# Patient Record
Sex: Male | Born: 1935 | Race: White | Hispanic: No | Marital: Married | Smoking: Former smoker
Health system: Southern US, Community
[De-identification: ages and names within clinical notes are randomized; demographics above are authoritative.]

## PROBLEM LIST (undated history)

## (undated) DIAGNOSIS — R001 Bradycardia, unspecified: Secondary | ICD-10-CM

## (undated) DIAGNOSIS — G8929 Other chronic pain: Secondary | ICD-10-CM

## (undated) DIAGNOSIS — I639 Cerebral infarction, unspecified: Secondary | ICD-10-CM

## (undated) DIAGNOSIS — E7800 Pure hypercholesterolemia, unspecified: Secondary | ICD-10-CM

## (undated) DIAGNOSIS — I5032 Chronic diastolic (congestive) heart failure: Secondary | ICD-10-CM

## (undated) DIAGNOSIS — I2510 Atherosclerotic heart disease of native coronary artery without angina pectoris: Secondary | ICD-10-CM

## (undated) DIAGNOSIS — I10 Essential (primary) hypertension: Secondary | ICD-10-CM

## (undated) DIAGNOSIS — I442 Atrioventricular block, complete: Secondary | ICD-10-CM

## (undated) DIAGNOSIS — I509 Heart failure, unspecified: Secondary | ICD-10-CM

## (undated) DIAGNOSIS — R079 Chest pain, unspecified: Secondary | ICD-10-CM

## (undated) DIAGNOSIS — M5416 Radiculopathy, lumbar region: Secondary | ICD-10-CM

## (undated) DIAGNOSIS — Z45018 Encounter for adjustment and management of other part of cardiac pacemaker: Secondary | ICD-10-CM

## (undated) DIAGNOSIS — I6529 Occlusion and stenosis of unspecified carotid artery: Secondary | ICD-10-CM

## (undated) DIAGNOSIS — I059 Rheumatic mitral valve disease, unspecified: Secondary | ICD-10-CM

## (undated) DIAGNOSIS — S72109A Unspecified trochanteric fracture of unspecified femur, initial encounter for closed fracture: Secondary | ICD-10-CM

## (undated) DIAGNOSIS — M5442 Lumbago with sciatica, left side: Secondary | ICD-10-CM

## (undated) DIAGNOSIS — M1990 Unspecified osteoarthritis, unspecified site: Secondary | ICD-10-CM

## (undated) DIAGNOSIS — Z950 Presence of cardiac pacemaker: Secondary | ICD-10-CM

## (undated) DIAGNOSIS — M25552 Pain in left hip: Secondary | ICD-10-CM

## (undated) DIAGNOSIS — N189 Chronic kidney disease, unspecified: Secondary | ICD-10-CM

## (undated) DIAGNOSIS — M4150 Other secondary scoliosis, site unspecified: Secondary | ICD-10-CM

## (undated) DIAGNOSIS — I219 Acute myocardial infarction, unspecified: Secondary | ICD-10-CM

## (undated) DIAGNOSIS — E119 Type 2 diabetes mellitus without complications: Secondary | ICD-10-CM

## (undated) DIAGNOSIS — M549 Dorsalgia, unspecified: Secondary | ICD-10-CM

## (undated) DIAGNOSIS — M48061 Spinal stenosis, lumbar region without neurogenic claudication: Secondary | ICD-10-CM

## (undated) DIAGNOSIS — Z9889 Other specified postprocedural states: Secondary | ICD-10-CM

## (undated) DIAGNOSIS — M4316 Spondylolisthesis, lumbar region: Secondary | ICD-10-CM

## (undated) DIAGNOSIS — G709 Myoneural disorder, unspecified: Secondary | ICD-10-CM

## (undated) DIAGNOSIS — G4730 Sleep apnea, unspecified: Secondary | ICD-10-CM

## (undated) DIAGNOSIS — K5790 Diverticulosis of intestine, part unspecified, without perforation or abscess without bleeding: Secondary | ICD-10-CM

## (undated) DIAGNOSIS — K219 Gastro-esophageal reflux disease without esophagitis: Secondary | ICD-10-CM

## (undated) DIAGNOSIS — I459 Conduction disorder, unspecified: Secondary | ICD-10-CM

## (undated) DIAGNOSIS — I480 Paroxysmal atrial fibrillation: Secondary | ICD-10-CM

## (undated) HISTORY — DX: Conduction disorder, unspecified: I45.9

## (undated) HISTORY — PX: APPENDECTOMY: SHX54

## (undated) HISTORY — DX: Other specified postprocedural states: Z98.890

## (undated) HISTORY — DX: Other secondary scoliosis, site unspecified: M41.50

## (undated) HISTORY — PX: AORTIC VALVE REPLACEMENT: SHX41

## (undated) HISTORY — DX: Spondylolisthesis, lumbar region: M43.16

## (undated) HISTORY — DX: Chest pain, unspecified: R07.9

## (undated) HISTORY — DX: Spinal stenosis, lumbar region without neurogenic claudication: M48.061

## (undated) HISTORY — PX: CORONARY ARTERY BYPASS GRAFT: SHX141

## (undated) HISTORY — DX: Other chronic pain: G89.29

## (undated) HISTORY — DX: Encounter for adjustment and management of other part of cardiac pacemaker: Z45.018

## (undated) HISTORY — PX: BACK SURGERY: SHX140

## (undated) HISTORY — DX: Chronic diastolic (congestive) heart failure: I50.32

## (undated) HISTORY — DX: Bradycardia, unspecified: R00.1

## (undated) HISTORY — DX: Radiculopathy, lumbar region: M54.16

## (undated) HISTORY — DX: Pain in left hip: M25.552

## (undated) HISTORY — DX: Essential (primary) hypertension: I10

## (undated) HISTORY — PX: COLON SURGERY: SHX602

## (undated) HISTORY — DX: Atrioventricular block, complete: I44.2

## (undated) HISTORY — PX: CORONARY ANGIOPLASTY: SHX604

## (undated) HISTORY — DX: Atherosclerotic heart disease of native coronary artery without angina pectoris: I25.10

## (undated) HISTORY — PX: HERNIA REPAIR: SHX51

## (undated) HISTORY — DX: Rheumatic mitral valve disease, unspecified: I05.9

## (undated) HISTORY — DX: Paroxysmal atrial fibrillation: I48.0

## (undated) HISTORY — DX: Lumbago with sciatica, left side: M54.42

## (undated) HISTORY — DX: Occlusion and stenosis of unspecified carotid artery: I65.29

---

## 2014-08-11 DIAGNOSIS — E119 Type 2 diabetes mellitus without complications: Secondary | ICD-10-CM | POA: Diagnosis not present

## 2014-08-11 DIAGNOSIS — I2510 Atherosclerotic heart disease of native coronary artery without angina pectoris: Secondary | ICD-10-CM | POA: Diagnosis not present

## 2014-08-11 DIAGNOSIS — I10 Essential (primary) hypertension: Secondary | ICD-10-CM | POA: Diagnosis not present

## 2014-08-11 DIAGNOSIS — N189 Chronic kidney disease, unspecified: Secondary | ICD-10-CM | POA: Diagnosis not present

## 2014-09-22 DIAGNOSIS — M5136 Other intervertebral disc degeneration, lumbar region: Secondary | ICD-10-CM | POA: Diagnosis not present

## 2014-09-22 DIAGNOSIS — M5126 Other intervertebral disc displacement, lumbar region: Secondary | ICD-10-CM | POA: Diagnosis not present

## 2014-09-22 DIAGNOSIS — Z6835 Body mass index (BMI) 35.0-35.9, adult: Secondary | ICD-10-CM | POA: Diagnosis not present

## 2014-09-22 DIAGNOSIS — R399 Unspecified symptoms and signs involving the genitourinary system: Secondary | ICD-10-CM | POA: Diagnosis not present

## 2014-09-22 DIAGNOSIS — M4806 Spinal stenosis, lumbar region: Secondary | ICD-10-CM | POA: Diagnosis not present

## 2014-09-29 DIAGNOSIS — E1149 Type 2 diabetes mellitus with other diabetic neurological complication: Secondary | ICD-10-CM | POA: Diagnosis not present

## 2014-09-29 DIAGNOSIS — N189 Chronic kidney disease, unspecified: Secondary | ICD-10-CM | POA: Diagnosis not present

## 2014-09-29 DIAGNOSIS — E785 Hyperlipidemia, unspecified: Secondary | ICD-10-CM | POA: Diagnosis not present

## 2014-09-29 DIAGNOSIS — Z125 Encounter for screening for malignant neoplasm of prostate: Secondary | ICD-10-CM | POA: Diagnosis not present

## 2014-10-01 DIAGNOSIS — E1149 Type 2 diabetes mellitus with other diabetic neurological complication: Secondary | ICD-10-CM | POA: Diagnosis not present

## 2014-10-01 DIAGNOSIS — I130 Hypertensive heart and chronic kidney disease with heart failure and stage 1 through stage 4 chronic kidney disease, or unspecified chronic kidney disease: Secondary | ICD-10-CM | POA: Diagnosis not present

## 2014-10-01 DIAGNOSIS — E1129 Type 2 diabetes mellitus with other diabetic kidney complication: Secondary | ICD-10-CM | POA: Diagnosis not present

## 2014-10-01 DIAGNOSIS — E785 Hyperlipidemia, unspecified: Secondary | ICD-10-CM | POA: Diagnosis not present

## 2014-10-01 DIAGNOSIS — I2510 Atherosclerotic heart disease of native coronary artery without angina pectoris: Secondary | ICD-10-CM | POA: Diagnosis not present

## 2014-10-15 DIAGNOSIS — M4807 Spinal stenosis, lumbosacral region: Secondary | ICD-10-CM | POA: Diagnosis not present

## 2014-10-15 DIAGNOSIS — M961 Postlaminectomy syndrome, not elsewhere classified: Secondary | ICD-10-CM | POA: Diagnosis not present

## 2014-10-24 DIAGNOSIS — Z981 Arthrodesis status: Secondary | ICD-10-CM | POA: Diagnosis not present

## 2014-10-24 DIAGNOSIS — M47896 Other spondylosis, lumbar region: Secondary | ICD-10-CM | POA: Diagnosis not present

## 2014-10-24 DIAGNOSIS — M4807 Spinal stenosis, lumbosacral region: Secondary | ICD-10-CM | POA: Diagnosis not present

## 2014-10-24 DIAGNOSIS — M4806 Spinal stenosis, lumbar region: Secondary | ICD-10-CM | POA: Diagnosis not present

## 2014-10-24 DIAGNOSIS — M5126 Other intervertebral disc displacement, lumbar region: Secondary | ICD-10-CM | POA: Diagnosis not present

## 2014-10-27 DIAGNOSIS — M961 Postlaminectomy syndrome, not elsewhere classified: Secondary | ICD-10-CM | POA: Diagnosis not present

## 2014-10-27 DIAGNOSIS — M4807 Spinal stenosis, lumbosacral region: Secondary | ICD-10-CM | POA: Diagnosis not present

## 2014-11-04 DIAGNOSIS — I480 Paroxysmal atrial fibrillation: Secondary | ICD-10-CM | POA: Diagnosis not present

## 2014-11-04 DIAGNOSIS — E119 Type 2 diabetes mellitus without complications: Secondary | ICD-10-CM | POA: Diagnosis not present

## 2014-11-04 DIAGNOSIS — I10 Essential (primary) hypertension: Secondary | ICD-10-CM | POA: Diagnosis not present

## 2014-11-04 DIAGNOSIS — I2510 Atherosclerotic heart disease of native coronary artery without angina pectoris: Secondary | ICD-10-CM | POA: Diagnosis not present

## 2014-11-08 DIAGNOSIS — M4807 Spinal stenosis, lumbosacral region: Secondary | ICD-10-CM | POA: Diagnosis not present

## 2014-11-08 DIAGNOSIS — M47897 Other spondylosis, lumbosacral region: Secondary | ICD-10-CM | POA: Diagnosis not present

## 2014-11-08 DIAGNOSIS — M5126 Other intervertebral disc displacement, lumbar region: Secondary | ICD-10-CM | POA: Diagnosis not present

## 2014-11-08 DIAGNOSIS — M47817 Spondylosis without myelopathy or radiculopathy, lumbosacral region: Secondary | ICD-10-CM | POA: Diagnosis not present

## 2014-11-11 DIAGNOSIS — Z950 Presence of cardiac pacemaker: Secondary | ICD-10-CM | POA: Diagnosis not present

## 2014-11-11 DIAGNOSIS — Z01818 Encounter for other preprocedural examination: Secondary | ICD-10-CM | POA: Diagnosis not present

## 2014-11-11 DIAGNOSIS — I2510 Atherosclerotic heart disease of native coronary artery without angina pectoris: Secondary | ICD-10-CM | POA: Diagnosis not present

## 2014-11-11 DIAGNOSIS — Z01810 Encounter for preprocedural cardiovascular examination: Secondary | ICD-10-CM | POA: Diagnosis not present

## 2014-11-14 DIAGNOSIS — I6522 Occlusion and stenosis of left carotid artery: Secondary | ICD-10-CM | POA: Diagnosis not present

## 2014-11-14 DIAGNOSIS — E119 Type 2 diabetes mellitus without complications: Secondary | ICD-10-CM | POA: Diagnosis not present

## 2014-11-14 DIAGNOSIS — E785 Hyperlipidemia, unspecified: Secondary | ICD-10-CM | POA: Diagnosis not present

## 2014-11-14 DIAGNOSIS — I10 Essential (primary) hypertension: Secondary | ICD-10-CM | POA: Diagnosis not present

## 2014-11-14 DIAGNOSIS — I2510 Atherosclerotic heart disease of native coronary artery without angina pectoris: Secondary | ICD-10-CM | POA: Diagnosis not present

## 2014-11-14 DIAGNOSIS — I6523 Occlusion and stenosis of bilateral carotid arteries: Secondary | ICD-10-CM | POA: Diagnosis not present

## 2014-11-15 DIAGNOSIS — I130 Hypertensive heart and chronic kidney disease with heart failure and stage 1 through stage 4 chronic kidney disease, or unspecified chronic kidney disease: Secondary | ICD-10-CM | POA: Diagnosis not present

## 2014-11-15 DIAGNOSIS — E1129 Type 2 diabetes mellitus with other diabetic kidney complication: Secondary | ICD-10-CM | POA: Diagnosis not present

## 2014-11-15 DIAGNOSIS — Z6835 Body mass index (BMI) 35.0-35.9, adult: Secondary | ICD-10-CM | POA: Diagnosis not present

## 2014-11-15 DIAGNOSIS — I2510 Atherosclerotic heart disease of native coronary artery without angina pectoris: Secondary | ICD-10-CM | POA: Diagnosis not present

## 2014-11-16 DIAGNOSIS — M4807 Spinal stenosis, lumbosacral region: Secondary | ICD-10-CM | POA: Diagnosis not present

## 2014-11-16 DIAGNOSIS — M961 Postlaminectomy syndrome, not elsewhere classified: Secondary | ICD-10-CM | POA: Diagnosis not present

## 2014-11-17 ENCOUNTER — Ambulatory Visit: Payer: Self-pay | Admitting: Orthopedic Surgery

## 2014-11-21 ENCOUNTER — Ambulatory Visit: Payer: Self-pay | Admitting: Orthopedic Surgery

## 2014-11-21 NOTE — H&P (Signed)
Travis [name] is an 81 y.o. male.   Chief Complaint: back and B/L leg pain HPI: The patient is a 81 year old male who presents today for follow up of their back. The patient is being followed for their low back symptoms. They are now year(s) out from when symptoms began. Symptoms reported today include: pain. Current treatment includes: activity modification and pain medications. The following medication has been used for pain control: Ultram. The patient presents today following MRI and ESI left L3-4 x 8 days. The patient reports the injection did not help. The patient indicates that they have questions or concerns today regarding surgery.  Travis SabaRufus Erxleben presents here with his family. He still reports an inability to ambulate. He has to sit. He does have some pain radiating down the leg even when he sits. He thinks it is in the back and also in the front.  Review of systems is negative for fevers, chest pain, shortness of breath, unexplained recent weight loss, loss of bowel or bladder function, burning with urination, joint swelling, rashes, weakness or numbness, difficulty with balance, easy bruising, excessive thirst or frequent urination.  He has been cleared by his cardiologist and his medical physician. His sugars are well controlled.  No past medical history on file.  No past surgical history on file.  No family history on file. Social History:  has no tobacco, alcohol, and drug history on file.  Allergies: Allergies not on file   (Not in a hospital admission)  No results found for this or any previous visit (from the past 48 hour(s)). No results found.  Review of Systems  Constitutional: Negative.   HENT: Negative.   Eyes: Negative.   Respiratory: Negative.   Cardiovascular: Negative.   Gastrointestinal: Negative.   Genitourinary: Negative.   Musculoskeletal: Positive for back pain.  Skin: Negative.   Neurological: Positive for focal weakness.  Psychiatric/Behavioral:  Negative.     There were no vitals taken for this visit. Physical Exam  Constitutional: He is oriented to person, place, and time. He appears well-developed and well-nourished.  HENT:  Head: Normocephalic and atraumatic.  Eyes: Conjunctivae and EOM are normal. Pupils are equal, round, and reactive to light.  Neck: Normal range of motion. Neck supple.  Cardiovascular: Normal rate and regular rhythm.   Respiratory: Effort normal and breath sounds normal.  GI: Soft. Bowel sounds are normal.  Musculoskeletal:  On exam, he walks with a forward flexed antalgic gait. Straight leg raise is lying flat, pain on the right. He has quads weakness bilaterally. Limited flexion and extension. No instability of hips, knees, and ankles.  Neurological: He is alert and oriented to person, place, and time. He has normal reflexes.  Skin: Skin is warm and dry.  Psychiatric: He has a normal mood and affect.    His x-rays demonstrate previous decompression, laminectomies of L5.  His MRI demonstrates severe stenosis at 3-4, moderate lateral recess at 4-5, and at T3. He has a cyst at 5-1 that it is unchanged.  Assessment/Plan Spinal stenosis 1. Neurogenic claudication. 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 Family and he would like to proceed with decompression. I had an extensive discussion of the risks and benefits of the lumbar decompression with the patient including bleeding, infection, damage to neurovascular structures, epidural fibrosis, CSF leak requiring repair. We also discussed increase in pain, adjacent segment disease, recurrent disc herniation, need for future surgery including repeat decompression and/or fusion. We  also discussed risks of postoperative hematoma, paralysis, anesthetic complications including DVT, PE, death, cardiopulmonary dysfunction. In addition, the perioperative and postoperative courses were discussed in detail  including the rehabilitative time and return to functional activity and work. I provided the patient with an illustrated handout and utilized the appropriate surgical models.  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 Also indicated that predominance of his symptoms are at 3-4 of the stenosis. We will try to decompress 4-5, but there was excessive scar tissue from his previous surgery and I would preclude further exploration intraoperatively based upon the risk, same for the cyst at L5-S1. I do not believe he is symptomatic from that given that was present on his previous studies when these symptoms were not present.  Plan lumbar decompression L2-3, L3-4, possible L4-5  Atlas Kuc M. PA-C for Dr. Beane 11/21/2014, 9:12 AM    

## 2014-11-28 ENCOUNTER — Other Ambulatory Visit (HOSPITAL_COMMUNITY): Payer: Self-pay | Admitting: *Deleted

## 2014-11-29 ENCOUNTER — Encounter (HOSPITAL_COMMUNITY): Payer: Self-pay

## 2014-11-29 ENCOUNTER — Ambulatory Visit (HOSPITAL_COMMUNITY)
Admission: RE | Admit: 2014-11-29 | Discharge: 2014-11-29 | Disposition: A | Discharge status: Home / Self Care | Payer: Medicare Other | Source: Ambulatory Visit | Attending: Orthopedic Surgery | Admitting: Orthopedic Surgery

## 2014-11-29 ENCOUNTER — Encounter (HOSPITAL_COMMUNITY)
Admission: RE | Admit: 2014-11-29 | Discharge: 2014-11-29 | Disposition: A | Discharge status: Home / Self Care | Payer: Medicare Other | Source: Ambulatory Visit | Attending: Specialist | Admitting: Specialist

## 2014-11-29 DIAGNOSIS — M4806 Spinal stenosis, lumbar region: Secondary | ICD-10-CM | POA: Diagnosis not present

## 2014-11-29 DIAGNOSIS — M5136 Other intervertebral disc degeneration, lumbar region: Secondary | ICD-10-CM | POA: Diagnosis not present

## 2014-11-29 DIAGNOSIS — Z01818 Encounter for other preprocedural examination: Secondary | ICD-10-CM | POA: Insufficient documentation

## 2014-11-29 DIAGNOSIS — M47816 Spondylosis without myelopathy or radiculopathy, lumbar region: Secondary | ICD-10-CM | POA: Diagnosis not present

## 2014-11-29 DIAGNOSIS — M48061 Spinal stenosis, lumbar region without neurogenic claudication: Secondary | ICD-10-CM

## 2014-11-29 HISTORY — DX: Presence of cardiac pacemaker: Z95.0

## 2014-11-29 HISTORY — DX: Unspecified osteoarthritis, unspecified site: M19.90

## 2014-11-29 HISTORY — DX: Essential (primary) hypertension: I10

## 2014-11-29 HISTORY — DX: Diverticulosis of intestine, part unspecified, without perforation or abscess without bleeding: K57.90

## 2014-11-29 HISTORY — DX: Dorsalgia, unspecified: M54.9

## 2014-11-29 HISTORY — DX: Unspecified trochanteric fracture of unspecified femur, initial encounter for closed fracture: S72.109A

## 2014-11-29 HISTORY — DX: Sleep apnea, unspecified: G47.30

## 2014-11-29 HISTORY — DX: Cerebral infarction, unspecified: I63.9

## 2014-11-29 HISTORY — DX: Acute myocardial infarction, unspecified: I21.9

## 2014-11-29 HISTORY — DX: Myoneural disorder, unspecified: G70.9

## 2014-11-29 HISTORY — DX: Chronic kidney disease, unspecified: N18.9

## 2014-11-29 HISTORY — DX: Pure hypercholesterolemia, unspecified: E78.00

## 2014-11-29 HISTORY — DX: Type 2 diabetes mellitus without complications: E11.9

## 2014-11-29 HISTORY — DX: Heart failure, unspecified: I50.9

## 2014-11-29 HISTORY — DX: Gastro-esophageal reflux disease without esophagitis: K21.9

## 2014-11-29 HISTORY — DX: Atherosclerotic heart disease of native coronary artery without angina pectoris: I25.10

## 2014-11-29 LAB — BASIC METABOLIC PANEL
Anion gap: 8 (ref 5–15)
BUN: 32 mg/dL — ABNORMAL HIGH (ref 6–23)
CO2: 25 mmol/L (ref 19–32)
Calcium: 9.6 mg/dL (ref 8.4–10.5)
Chloride: 106 mmol/L (ref 96–112)
Creatinine, Ser: 2 mg/dL — ABNORMAL HIGH (ref 0.50–1.35)
GFR, EST AFRICAN AMERICAN: 34 mL/min — AB (ref >90)
GFR, EST NON AFRICAN AMERICAN: 30 mL/min — AB (ref >90)
Glucose, Bld: 191 mg/dL — ABNORMAL HIGH (ref 70–99)
POTASSIUM: 5.2 mmol/L — AB (ref 3.5–5.1)
SODIUM: 139 mmol/L (ref 135–145)

## 2014-11-29 LAB — ABO/RH: ABO/RH(D): O POS

## 2014-11-29 LAB — CBC
HCT: 41.5 % (ref 39.0–52.0)
Hemoglobin: 14.2 g/dL (ref 13.0–17.0)
MCH: 30.1 pg (ref 26.0–34.0)
MCHC: 34.2 g/dL (ref 30.0–36.0)
MCV: 87.9 fL (ref 78.0–100.0)
Platelets: 168 10*3/uL (ref 150–400)
RBC: 4.72 MIL/uL (ref 4.22–5.81)
RDW: 14.7 % (ref 11.5–15.5)
WBC: 11.6 10*3/uL — AB (ref 4.0–10.5)

## 2014-11-29 LAB — SURGICAL PCR SCREEN
MRSA, PCR: NEGATIVE
STAPHYLOCOCCUS AUREUS: NEGATIVE

## 2014-11-29 NOTE — Patient Instructions (Addendum)
[address] Fre[address]y  11/29/2014   Your procedure is scheduled on:  5-4 -2016 Wednesday  [facility] Arrive at       0630  AM   Call this number if you have problems the morning of surgery: [phone]  Or Presurgical Testing [phone].   For Living Will and/or Health Care Power Attorney Forms: please provide copy for your medical record,may bring AM of surgery(Forms should be already notarized -we do not provide this service).(11-29-14  No information preferred today).     Do not eat food/ or drink: After Midnight    Take these medicines the morning of surgery with A SIP OF WATER: Amlodipine. Metoprolol. Pantoprazole. Use 1/2 usual PM dose of Insulin night before. Take no Insulin or diabetic meds AM of surgery. Use/bring Inhaler-if needed.   Do not wear jewelry, make-up or nail polish.  Do not wear deodorant, lotions, powders, or perfumes.   Do not shave legs and under arms- 48 hours(2 days) prior to first CHG shower.(Shaving face and neck okay.)  Do not bring valuables to the hospital.(Hospital is not responsible for lost valuables).  Contacts, dentures or removable bridgework, body piercing, hair pins may not be worn into surgery.  Leave suitcase in the car. After surgery it may be brought to your room.  For patients admitted to the hospital, checkout time is 11:00 AM the day of discharge.(Restricted visitors-Any Persons displaying flu-like symptoms or illness).    Patients discharged the day of surgery will not be allowed to drive home. Must have responsible person with you x 24 hours once discharged.  Name and phone number of your driver: [name]/ Onalee HuaDavid [name]--daughter/son in [place] [phone] cell/     Please read over the following fact sheets that you were given:  CHG(Chlorhexidine Gluconate 4% Surgical Soap) use, MRSA Information.  Remember : Type/Screen "Blue armbands" - may not be removed once applied(would result  in being retested AM of surgery, if removed).         [facility] - Preparing for Surgery Before surgery, you can play an important role.  Because skin is not sterile, your skin needs to be as free of germs as possible.  You can reduce the number of germs on your skin by washing with CHG (chlorahexidine gluconate) soap before surgery.  CHG is an antiseptic cleaner which kills germs and bonds with the skin to continue killing germs even after washing. Please DO NOT use if you have an allergy to CHG or antibacterial soaps.  If your skin becomes reddened/irritated stop using the CHG and inform your nurse when you arrive at Short Stay. Do not shave (including legs and underarms) for at least 48 hours prior to the first CHG shower.  You may shave your face/neck. Please follow these instructions carefully:  1.  Shower with CHG Soap the night before surgery and the  morning of Surgery.  2.  If you choose to wash your hair, wash your hair first as usual with your  normal  shampoo.  3.  After you shampoo, rinse your hair and body thoroughly to remove the  shampoo.                           4.  Use CHG as you would any other liquid soap.  You can apply chg directly  to the skin and wash  Gently with a scrungie or clean washcloth.  5.  Apply the CHG Soap to your body ONLY FROM THE NECK DOWN.   Do not use on face/ open                           Wound or open sores. Avoid contact with eyes, ears mouth and genitals (private parts).                       Wash face,  Genitals (private parts) with your normal soap.             6.  Wash thoroughly, paying special attention to the area where your surgery  will be performed.  7.  Thoroughly rinse your body with warm water from the neck down.  8.  DO NOT shower/wash with your normal soap after using and rinsing off  the CHG Soap.                9.  Pat yourself dry with a clean towel.            10.  Wear clean pajamas.            11.  Place  clean sheets on your bed the night of your first shower and do not  sleep with pets. Day of Surgery : Do not apply any lotions/deodorants the morning of surgery.  Please wear clean clothes to the hospital/surgery center.  FAILURE TO FOLLOW THESE INSTRUCTIONS MAY RESULT IN THE CANCELLATION OF YOUR SURGERY PATIENT SIGNATURE_________________________________  NURSE SIGNATURE__________________________________  ________________________________________________________________________   Travis [name]  An incentive spirometer is a tool that can help keep your lungs clear and active. This tool measures how well you are filling your lungs with each breath. Taking long deep breaths may help reverse or decrease the chance of developing breathing (pulmonary) problems (especially infection) following:  A long period of time when you are unable to move or be active. BEFORE THE PROCEDURE   If the spirometer includes an indicator to show your best effort, your nurse or respiratory therapist will set it to a desired goal.  If possible, sit up straight or lean slightly forward. Try not to slouch.  Hold the incentive spirometer in an upright position. INSTRUCTIONS FOR USE  1. Sit on the edge of your bed if possible, or sit up as far as you can in bed or on a chair. 2. Hold the incentive spirometer in an upright position. 3. Breathe out normally. 4. Place the mouthpiece in your mouth and seal your lips tightly around it. 5. Breathe in slowly and as deeply as possible, raising the piston or the ball toward the top of the column. 6. Hold your breath for 3-5 seconds or for as long as possible. Allow the piston or ball to fall to the bottom of the column. 7. Remove the mouthpiece from your mouth and breathe out normally. 8. Rest for a few seconds and repeat Steps 1 through 7 at least 10 times every 1-2 hours when you are awake. Take your time and take a few normal breaths between deep breaths. 9. The  spirometer may include an indicator to show your best effort. Use the indicator as a goal to work toward during each repetition. 10. After each set of 10 deep breaths, practice coughing to be sure your lungs are clear. If you have an incision (the cut made at the time of  surgery), support your incision when coughing by placing a pillow or rolled up towels firmly against it. Once you are able to get out of bed, walk around indoors and cough well. You may stop using the incentive spirometer when instructed by your caregiver.  RISKS AND COMPLICATIONS  Take your time so you do not get dizzy or light-headed.  If you are in pain, you may need to take or ask for pain medication before doing incentive spirometry. It is harder to take a deep breath if you are having pain. AFTER USE  Rest and breathe slowly and easily.  It can be helpful to keep track of a log of your progress. Your caregiver can provide you with a simple table to help with this. If you are using the spirometer at home, follow these instructions: [place] IF:   You are having difficultly using the spirometer.  You have trouble using the spirometer as often as instructed.  Your pain medication is not giving enough relief while using the spirometer.  You develop fever of 100.5 F (38.1 C) or higher. SEEK IMMEDIATE MEDICAL CARE IF:   You cough up bloody sputum that had not been present before.  You develop fever of 102 F (38.9 C) or greater.  You develop worsening pain at or near the incision site. MAKE SURE YOU:   Understand these instructions.  Will watch your condition.  Will get help right away if you are not doing well or get worse. Document Released: 12/02/2006 Document Revised: 10/14/2011 Document Reviewed: 02/02/2007 [facility] Patient Information 2014 [place], [place].   ________________________________________________________________________

## 2014-11-29 NOTE — Progress Notes (Signed)
11-29-14 1655 Labs viewable in Epic, note CMP-has history of renal insufficiency.

## 2014-11-29 NOTE — Pre-Procedure Instructions (Addendum)
11-29-14 EKG 7'15,Stress test 11-11-14, CXR 1 view 02-07-14, Medical Clearance Dr. Tomasa BlaseSchultz with chart. Signed cardiac orders with chart, second attempt to get all pertinent info completed. 11-29-14 1655 Note fax to Dr. Ermelinda DasBeane's office  for review of labs in [place].

## 2014-12-07 ENCOUNTER — Ambulatory Visit (HOSPITAL_COMMUNITY): Payer: Medicare Other

## 2014-12-07 ENCOUNTER — Ambulatory Visit (HOSPITAL_COMMUNITY): Payer: Medicare Other | Admitting: Anesthesiology

## 2014-12-07 ENCOUNTER — Encounter (HOSPITAL_COMMUNITY)
Admission: RE | Disposition: A | Discharge status: Home Health | Payer: Self-pay | Source: Ambulatory Visit | Attending: Specialist

## 2014-12-07 ENCOUNTER — Inpatient Hospital Stay (HOSPITAL_COMMUNITY)
Admission: RE | Admit: 2014-12-07 | Discharge: 2014-12-10 | DRG: 519 | Disposition: A | Discharge status: Home Health | Payer: Medicare Other | Source: Ambulatory Visit | Attending: Specialist | Admitting: Specialist

## 2014-12-07 ENCOUNTER — Encounter (HOSPITAL_COMMUNITY): Payer: Self-pay | Admitting: *Deleted

## 2014-12-07 DIAGNOSIS — I252 Old myocardial infarction: Secondary | ICD-10-CM | POA: Diagnosis not present

## 2014-12-07 DIAGNOSIS — I129 Hypertensive chronic kidney disease with stage 1 through stage 4 chronic kidney disease, or unspecified chronic kidney disease: Secondary | ICD-10-CM | POA: Diagnosis not present

## 2014-12-07 DIAGNOSIS — E669 Obesity, unspecified: Secondary | ICD-10-CM | POA: Diagnosis present

## 2014-12-07 DIAGNOSIS — I2510 Atherosclerotic heart disease of native coronary artery without angina pectoris: Secondary | ICD-10-CM | POA: Diagnosis not present

## 2014-12-07 DIAGNOSIS — Z6833 Body mass index (BMI) 33.0-33.9, adult: Secondary | ICD-10-CM

## 2014-12-07 DIAGNOSIS — N058 Unspecified nephritic syndrome with other morphologic changes: Secondary | ICD-10-CM | POA: Diagnosis not present

## 2014-12-07 DIAGNOSIS — M1990 Unspecified osteoarthritis, unspecified site: Secondary | ICD-10-CM | POA: Diagnosis not present

## 2014-12-07 DIAGNOSIS — Z8673 Personal history of transient ischemic attack (TIA), and cerebral infarction without residual deficits: Secondary | ICD-10-CM

## 2014-12-07 DIAGNOSIS — G4730 Sleep apnea, unspecified: Secondary | ICD-10-CM | POA: Diagnosis present

## 2014-12-07 DIAGNOSIS — Z952 Presence of prosthetic heart valve: Secondary | ICD-10-CM | POA: Diagnosis not present

## 2014-12-07 DIAGNOSIS — Z794 Long term (current) use of insulin: Secondary | ICD-10-CM | POA: Diagnosis not present

## 2014-12-07 DIAGNOSIS — E1129 Type 2 diabetes mellitus with other diabetic kidney complication: Secondary | ICD-10-CM | POA: Diagnosis not present

## 2014-12-07 DIAGNOSIS — E1365 Other specified diabetes mellitus with hyperglycemia: Secondary | ICD-10-CM | POA: Diagnosis not present

## 2014-12-07 DIAGNOSIS — Z951 Presence of aortocoronary bypass graft: Secondary | ICD-10-CM | POA: Diagnosis not present

## 2014-12-07 DIAGNOSIS — K219 Gastro-esophageal reflux disease without esophagitis: Secondary | ICD-10-CM | POA: Diagnosis present

## 2014-12-07 DIAGNOSIS — M48061 Spinal stenosis, lumbar region without neurogenic claudication: Secondary | ICD-10-CM

## 2014-12-07 DIAGNOSIS — R262 Difficulty in walking, not elsewhere classified: Secondary | ICD-10-CM | POA: Diagnosis not present

## 2014-12-07 DIAGNOSIS — E119 Type 2 diabetes mellitus without complications: Secondary | ICD-10-CM | POA: Diagnosis not present

## 2014-12-07 DIAGNOSIS — Z87891 Personal history of nicotine dependence: Secondary | ICD-10-CM | POA: Diagnosis not present

## 2014-12-07 DIAGNOSIS — I10 Essential (primary) hypertension: Secondary | ICD-10-CM

## 2014-12-07 DIAGNOSIS — M545 Low back pain: Secondary | ICD-10-CM | POA: Diagnosis present

## 2014-12-07 DIAGNOSIS — Z950 Presence of cardiac pacemaker: Secondary | ICD-10-CM

## 2014-12-07 DIAGNOSIS — M5126 Other intervertebral disc displacement, lumbar region: Secondary | ICD-10-CM | POA: Diagnosis present

## 2014-12-07 DIAGNOSIS — M4806 Spinal stenosis, lumbar region: Principal | ICD-10-CM | POA: Diagnosis present

## 2014-12-07 DIAGNOSIS — Z419 Encounter for procedure for purposes other than remedying health state, unspecified: Secondary | ICD-10-CM

## 2014-12-07 DIAGNOSIS — N189 Chronic kidney disease, unspecified: Secondary | ICD-10-CM | POA: Diagnosis not present

## 2014-12-07 DIAGNOSIS — M4846XA Fatigue fracture of vertebra, lumbar region, initial encounter for fracture: Secondary | ICD-10-CM | POA: Diagnosis not present

## 2014-12-07 DIAGNOSIS — I5032 Chronic diastolic (congestive) heart failure: Secondary | ICD-10-CM | POA: Diagnosis present

## 2014-12-07 HISTORY — PX: LUMBAR LAMINECTOMY/DECOMPRESSION MICRODISCECTOMY: SHX5026

## 2014-12-07 HISTORY — DX: Spinal stenosis, lumbar region without neurogenic claudication: M48.061

## 2014-12-07 LAB — BASIC METABOLIC PANEL
ANION GAP: 10 (ref 5–15)
BUN: 24 mg/dL — ABNORMAL HIGH (ref 6–20)
CO2: 25 mmol/L (ref 22–32)
CREATININE: 1.59 mg/dL — AB (ref 0.61–1.24)
Calcium: 8.7 mg/dL — ABNORMAL LOW (ref 8.9–10.3)
Chloride: 105 mmol/L (ref 101–111)
GFR calc Af Amer: 45 mL/min — ABNORMAL LOW (ref >60)
GFR calc non Af Amer: 39 mL/min — ABNORMAL LOW (ref >60)
GLUCOSE: 314 mg/dL — AB (ref 70–99)
Potassium: 4.9 mmol/L (ref 3.5–5.1)
Sodium: 140 mmol/L (ref 135–145)

## 2014-12-07 LAB — TYPE AND SCREEN
ABO/RH(D): O POS
ANTIBODY SCREEN: NEGATIVE

## 2014-12-07 LAB — GLUCOSE, CAPILLARY
GLUCOSE-CAPILLARY: 264 mg/dL — AB (ref 70–99)
Glucose-Capillary: 190 mg/dL — ABNORMAL HIGH (ref 70–99)
Glucose-Capillary: 204 mg/dL — ABNORMAL HIGH (ref 70–99)
Glucose-Capillary: 337 mg/dL — ABNORMAL HIGH (ref 70–99)

## 2014-12-07 SURGERY — LUMBAR LAMINECTOMY/DECOMPRESSION MICRODISCECTOMY 3 LEVELS
Anesthesia: General | Site: Back

## 2014-12-07 MED ORDER — INSULIN NPH (HUMAN) (ISOPHANE) 100 UNIT/ML ~~LOC~~ SUSP
20.0000 [IU] | Freq: Two times a day (BID) | SUBCUTANEOUS | Status: DC
  Administered 2014-12-07: 20 [IU] via SUBCUTANEOUS

## 2014-12-07 MED ORDER — ONDANSETRON HCL 4 MG/2ML IJ SOLN
INTRAMUSCULAR | Status: AC
  Filled 2014-12-07: qty 2

## 2014-12-07 MED ORDER — SODIUM CHLORIDE 0.9 % IV SOLN
INTRAVENOUS | Status: DC
  Administered 2014-12-07: 1000 mL via INTRAVENOUS

## 2014-12-07 MED ORDER — BUPIVACAINE-EPINEPHRINE (PF) 0.5% -1:200000 IJ SOLN
INTRAMUSCULAR | Status: AC
  Filled 2014-12-07: qty 30

## 2014-12-07 MED ORDER — ALBUTEROL SULFATE (2.5 MG/3ML) 0.083% IN NEBU: 2.5000 mg | INHALATION_SOLUTION | Freq: Four times a day (QID) | RESPIRATORY_TRACT | Status: DC | PRN

## 2014-12-07 MED ORDER — METOPROLOL TARTRATE 25 MG PO TABS
25.0000 mg | ORAL_TABLET | Freq: Two times a day (BID) | ORAL | Status: DC
  Administered 2014-12-07: 25 mg via ORAL
  Filled 2014-12-07: qty 1

## 2014-12-07 MED ORDER — PANTOPRAZOLE SODIUM 40 MG PO TBEC
40.0000 mg | DELAYED_RELEASE_TABLET | Freq: Every day | ORAL | Status: DC
  Administered 2014-12-07 – 2014-12-10 (×4): 40 mg via ORAL
  Filled 2014-12-07 (×4): qty 1

## 2014-12-07 MED ORDER — THROMBIN 5000 UNITS EX SOLR
CUTANEOUS | Status: AC
  Filled 2014-12-07: qty 10000

## 2014-12-07 MED ORDER — MAGNESIUM CITRATE PO SOLN: 1.0000 | Freq: Once | ORAL | Status: AC | PRN

## 2014-12-07 MED ORDER — AMLODIPINE BESYLATE 5 MG PO TABS
5.0000 mg | ORAL_TABLET | Freq: Every day | ORAL | Status: DC
  Administered 2014-12-07 – 2014-12-10 (×4): 5 mg via ORAL
  Filled 2014-12-07 (×4): qty 1

## 2014-12-07 MED ORDER — CEFAZOLIN SODIUM-DEXTROSE 2-3 GM-% IV SOLR
INTRAVENOUS | Status: AC
  Filled 2014-12-07: qty 50

## 2014-12-07 MED ORDER — SODIUM CHLORIDE 0.9 % IR SOLN
Status: DC | PRN
  Administered 2014-12-07: 500 mL

## 2014-12-07 MED ORDER — METHOCARBAMOL 1000 MG/10ML IJ SOLN
500.0000 mg | Freq: Four times a day (QID) | INTRAVENOUS | Status: DC | PRN
  Administered 2014-12-07: 500 mg via INTRAVENOUS
  Filled 2014-12-07 (×2): qty 5

## 2014-12-07 MED ORDER — METOPROLOL TARTRATE 25 MG PO TABS
25.0000 mg | ORAL_TABLET | Freq: Two times a day (BID) | ORAL | Status: DC
  Administered 2014-12-07 – 2014-12-10 (×6): 25 mg via ORAL
  Filled 2014-12-07 (×8): qty 1

## 2014-12-07 MED ORDER — INSULIN NPH (HUMAN) (ISOPHANE) 100 UNIT/ML ~~LOC~~ SUSP
10.0000 [IU] | Freq: Two times a day (BID) | SUBCUTANEOUS | Status: DC
  Filled 2014-12-07: qty 10

## 2014-12-07 MED ORDER — LACTATED RINGERS IV SOLN
INTRAVENOUS | Status: DC
  Administered 2014-12-07: 08:00:00 via INTRAVENOUS

## 2014-12-07 MED ORDER — INSULIN ASPART 100 UNIT/ML ~~LOC~~ SOLN
0.0000 [IU] | Freq: Three times a day (TID) | SUBCUTANEOUS | Status: DC
  Administered 2014-12-07: 11 [IU] via SUBCUTANEOUS
  Administered 2014-12-08: 5 [IU] via SUBCUTANEOUS
  Administered 2014-12-08 (×2): 3 [IU] via SUBCUTANEOUS
  Administered 2014-12-09: 2 [IU] via SUBCUTANEOUS
  Administered 2014-12-10: 5 [IU] via SUBCUTANEOUS

## 2014-12-07 MED ORDER — CISATRACURIUM BESYLATE (PF) 10 MG/5ML IV SOLN
INTRAVENOUS | Status: DC | PRN
  Administered 2014-12-07: 6 mg via INTRAVENOUS
  Administered 2014-12-07: 2 mg via INTRAVENOUS
  Administered 2014-12-07: 3 mg via INTRAVENOUS

## 2014-12-07 MED ORDER — HYDROMORPHONE HCL 1 MG/ML IJ SOLN
0.2500 mg | INTRAMUSCULAR | Status: DC | PRN
  Administered 2014-12-07: 0.5 mg via INTRAVENOUS

## 2014-12-07 MED ORDER — DOCUSATE SODIUM 100 MG PO CAPS: 100.0000 mg | ORAL_CAPSULE | Freq: Two times a day (BID) | ORAL | Status: AC | PRN

## 2014-12-07 MED ORDER — HYDROCODONE-ACETAMINOPHEN 5-325 MG PO TABS
1.0000 | ORAL_TABLET | ORAL | Status: DC | PRN
  Administered 2014-12-07: 2 via ORAL
  Administered 2014-12-09 – 2014-12-10 (×2): 1 via ORAL
  Filled 2014-12-07 (×2): qty 1
  Filled 2014-12-07: qty 2

## 2014-12-07 MED ORDER — RISAQUAD PO CAPS
1.0000 | ORAL_CAPSULE | Freq: Every day | ORAL | Status: DC
  Administered 2014-12-08 – 2014-12-10 (×3): 1 via ORAL
  Filled 2014-12-07 (×4): qty 1

## 2014-12-07 MED ORDER — HYDRALAZINE HCL 20 MG/ML IJ SOLN
10.0000 mg | Freq: Three times a day (TID) | INTRAMUSCULAR | Status: DC | PRN
  Administered 2014-12-07: 10 mg via INTRAVENOUS
  Filled 2014-12-07: qty 1

## 2014-12-07 MED ORDER — DIAZEPAM 5 MG PO TABS: 10.0000 mg | ORAL_TABLET | Freq: Four times a day (QID) | ORAL | Status: DC | PRN

## 2014-12-07 MED ORDER — THROMBIN 5000 UNITS EX SOLR
CUTANEOUS | Status: DC | PRN
  Administered 2014-12-07: 11:00:00 via TOPICAL

## 2014-12-07 MED ORDER — ALBUTEROL SULFATE HFA 108 (90 BASE) MCG/ACT IN AERS
2.0000 | INHALATION_SPRAY | Freq: Four times a day (QID) | RESPIRATORY_TRACT | Status: DC | PRN
Start: 2014-12-07 — End: 2014-12-07

## 2014-12-07 MED ORDER — METOCLOPRAMIDE HCL 5 MG/ML IJ SOLN
INTRAMUSCULAR | Status: DC | PRN
  Administered 2014-12-07: 10 mg via INTRAVENOUS

## 2014-12-07 MED ORDER — BISACODYL 5 MG PO TBEC: 5.0000 mg | DELAYED_RELEASE_TABLET | Freq: Every day | ORAL | Status: DC | PRN

## 2014-12-07 MED ORDER — KETOROLAC TROMETHAMINE 30 MG/ML IJ SOLN
INTRAMUSCULAR | Status: AC
  Filled 2014-12-07: qty 1

## 2014-12-07 MED ORDER — ETOMIDATE 2 MG/ML IV SOLN
INTRAVENOUS | Status: DC | PRN
  Administered 2014-12-07: 10 mg via INTRAVENOUS

## 2014-12-07 MED ORDER — GLYCOPYRROLATE 0.2 MG/ML IJ SOLN
INTRAMUSCULAR | Status: DC | PRN
  Administered 2014-12-07: 0.6 mg via INTRAVENOUS

## 2014-12-07 MED ORDER — PHENYLEPHRINE HCL 10 MG/ML IJ SOLN
10.0000 mg | INTRAVENOUS | Status: DC | PRN
  Administered 2014-12-07: 25 ug/min via INTRAVENOUS

## 2014-12-07 MED ORDER — SENNOSIDES-DOCUSATE SODIUM 8.6-50 MG PO TABS
1.0000 | ORAL_TABLET | Freq: Every evening | ORAL | Status: DC | PRN
Start: 2014-12-07 — End: 2014-12-10

## 2014-12-07 MED ORDER — HYDROMORPHONE HCL 1 MG/ML IJ SOLN
INTRAMUSCULAR | Status: AC
  Filled 2014-12-07: qty 1

## 2014-12-07 MED ORDER — CEFAZOLIN SODIUM-DEXTROSE 2-3 GM-% IV SOLR
2.0000 g | Freq: Three times a day (TID) | INTRAVENOUS | Status: AC
  Administered 2014-12-07 – 2014-12-08 (×3): 2 g via INTRAVENOUS
  Filled 2014-12-07 (×3): qty 50

## 2014-12-07 MED ORDER — TRAMADOL HCL 50 MG PO TABS
50.0000 mg | ORAL_TABLET | Freq: Four times a day (QID) | ORAL | Status: DC | PRN
  Administered 2014-12-07 – 2014-12-08 (×2): 50 mg via ORAL
  Filled 2014-12-07 (×2): qty 1

## 2014-12-07 MED ORDER — FENTANYL CITRATE (PF) 100 MCG/2ML IJ SOLN
INTRAMUSCULAR | Status: AC
  Filled 2014-12-07: qty 2

## 2014-12-07 MED ORDER — CEFAZOLIN SODIUM-DEXTROSE 2-3 GM-% IV SOLR
2.0000 g | INTRAVENOUS | Status: AC
  Administered 2014-12-07: 2 g via INTRAVENOUS

## 2014-12-07 MED ORDER — HYDROCODONE-ACETAMINOPHEN 5-325 MG PO TABS: 1.0000 | ORAL_TABLET | ORAL | Status: AC | PRN

## 2014-12-07 MED ORDER — HYDROMORPHONE HCL 1 MG/ML IJ SOLN
0.5000 mg | INTRAMUSCULAR | Status: DC | PRN
  Administered 2014-12-07: 1 mg via INTRAVENOUS
  Administered 2014-12-07 – 2014-12-08 (×2): 0.5 mg via INTRAVENOUS
  Filled 2014-12-07 (×4): qty 1

## 2014-12-07 MED ORDER — POLYMYXIN B SULFATE 500000 UNITS IJ SOLR
INTRAMUSCULAR | Status: AC
  Filled 2014-12-07: qty 1

## 2014-12-07 MED ORDER — GLYCOPYRROLATE 0.2 MG/ML IJ SOLN
INTRAMUSCULAR | Status: AC
  Filled 2014-12-07: qty 1

## 2014-12-07 MED ORDER — INSULIN ASPART 100 UNIT/ML ~~LOC~~ SOLN: 20.0000 [IU] | Freq: Two times a day (BID) | SUBCUTANEOUS | Status: DC

## 2014-12-07 MED ORDER — PROPOFOL 10 MG/ML IV BOLUS
INTRAVENOUS | Status: DC | PRN
  Administered 2014-12-07: 100 mg via INTRAVENOUS

## 2014-12-07 MED ORDER — METOCLOPRAMIDE HCL 5 MG/ML IJ SOLN
INTRAMUSCULAR | Status: AC
  Filled 2014-12-07: qty 2

## 2014-12-07 MED ORDER — DEXAMETHASONE SODIUM PHOSPHATE 10 MG/ML IJ SOLN
INTRAMUSCULAR | Status: DC | PRN
  Administered 2014-12-07: 10 mg via INTRAVENOUS

## 2014-12-07 MED ORDER — ACETAMINOPHEN 325 MG PO TABS: 650.0000 mg | ORAL_TABLET | ORAL | Status: DC | PRN

## 2014-12-07 MED ORDER — LACTATED RINGERS IV SOLN: INTRAVENOUS | Status: DC

## 2014-12-07 MED ORDER — NEOSTIGMINE METHYLSULFATE 10 MG/10ML IV SOLN
INTRAVENOUS | Status: AC
  Filled 2014-12-07: qty 1

## 2014-12-07 MED ORDER — OXYCODONE-ACETAMINOPHEN 5-325 MG PO TABS
1.0000 | ORAL_TABLET | ORAL | Status: DC | PRN
  Administered 2014-12-08: 2 via ORAL
  Administered 2014-12-08: 1 via ORAL
  Administered 2014-12-09: 2 via ORAL
  Filled 2014-12-07: qty 2
  Filled 2014-12-07: qty 1
  Filled 2014-12-07: qty 2

## 2014-12-07 MED ORDER — SUCCINYLCHOLINE CHLORIDE 20 MG/ML IJ SOLN
INTRAMUSCULAR | Status: DC | PRN
  Administered 2014-12-07: 100 mg via INTRAVENOUS

## 2014-12-07 MED ORDER — BUPIVACAINE-EPINEPHRINE (PF) 0.5% -1:200000 IJ SOLN
INTRAMUSCULAR | Status: DC | PRN
  Administered 2014-12-07 (×2): 10 mL

## 2014-12-07 MED ORDER — CISATRACURIUM BESYLATE 20 MG/10ML IV SOLN
INTRAVENOUS | Status: AC
  Filled 2014-12-07: qty 10

## 2014-12-07 MED ORDER — FENTANYL CITRATE (PF) 100 MCG/2ML IJ SOLN
INTRAMUSCULAR | Status: DC | PRN
  Administered 2014-12-07 (×2): 50 ug via INTRAVENOUS

## 2014-12-07 MED ORDER — ONDANSETRON HCL 4 MG/2ML IJ SOLN
INTRAMUSCULAR | Status: DC | PRN
  Administered 2014-12-07: 4 mg via INTRAVENOUS

## 2014-12-07 MED ORDER — NEOSTIGMINE METHYLSULFATE 10 MG/10ML IV SOLN
INTRAVENOUS | Status: DC | PRN
  Administered 2014-12-07: 4 mg via INTRAVENOUS

## 2014-12-07 MED ORDER — PROPOFOL 10 MG/ML IV BOLUS
INTRAVENOUS | Status: AC
  Filled 2014-12-07: qty 20

## 2014-12-07 MED ORDER — ACETAMINOPHEN 650 MG RE SUPP: 650.0000 mg | RECTAL | Status: DC | PRN

## 2014-12-07 MED ORDER — DEXAMETHASONE SODIUM PHOSPHATE 10 MG/ML IJ SOLN
INTRAMUSCULAR | Status: AC
Start: 2014-12-07 — End: 2014-12-07
  Filled 2014-12-07: qty 1

## 2014-12-07 MED ORDER — DOCUSATE SODIUM 100 MG PO CAPS
100.0000 mg | ORAL_CAPSULE | Freq: Two times a day (BID) | ORAL | Status: DC
  Administered 2014-12-07 – 2014-12-10 (×6): 100 mg via ORAL
  Filled 2014-12-07 (×2): qty 1

## 2014-12-07 MED ORDER — ONDANSETRON HCL 4 MG/2ML IJ SOLN
4.0000 mg | INTRAMUSCULAR | Status: DC | PRN
  Administered 2014-12-07 (×2): 4 mg via INTRAVENOUS
  Filled 2014-12-07 (×2): qty 2

## 2014-12-07 MED ORDER — METHOCARBAMOL 500 MG PO TABS: 500.0000 mg | ORAL_TABLET | Freq: Four times a day (QID) | ORAL | Status: DC | PRN

## 2014-12-07 MED ORDER — MENTHOL 3 MG MT LOZG
1.0000 | LOZENGE | OROMUCOSAL | Status: DC | PRN
  Filled 2014-12-07: qty 9

## 2014-12-07 MED ORDER — FUROSEMIDE 40 MG PO TABS
40.0000 mg | ORAL_TABLET | Freq: Every day | ORAL | Status: DC | PRN
  Filled 2014-12-07: qty 1

## 2014-12-07 MED ORDER — ALUM & MAG HYDROXIDE-SIMETH 200-200-20 MG/5ML PO SUSP: 30.0000 mL | Freq: Four times a day (QID) | ORAL | Status: DC | PRN

## 2014-12-07 MED ORDER — GLYCOPYRROLATE 0.2 MG/ML IJ SOLN
INTRAMUSCULAR | Status: AC
  Filled 2014-12-07: qty 3

## 2014-12-07 MED ORDER — PROMETHAZINE HCL 25 MG/ML IJ SOLN: 6.2500 mg | INTRAMUSCULAR | Status: DC | PRN

## 2014-12-07 MED ORDER — PHENOL 1.4 % MT LIQD: 1.0000 | OROMUCOSAL | Status: DC | PRN

## 2014-12-07 SURGICAL SUPPLY — 46 items
BAG ZIPLOCK 12X15 (MISCELLANEOUS) IMPLANT
CHLORAPREP W/TINT 26ML (MISCELLANEOUS) IMPLANT
CLEANER TIP ELECTROSURG 2X2 (MISCELLANEOUS) ×3 IMPLANT
CLOSURE WOUND 1/2 X4 (GAUZE/BANDAGES/DRESSINGS)
CLOTH 2% CHLOROHEXIDINE 3PK (PERSONAL CARE ITEMS) ×3 IMPLANT
DRAPE MICROSCOPE LEICA (MISCELLANEOUS) ×3 IMPLANT
DRAPE POUCH INSTRU U-SHP 10X18 (DRAPES) ×3 IMPLANT
DRAPE SURG 17X11 SM STRL (DRAPES) ×3 IMPLANT
DRAPE UTILITY XL STRL (DRAPES) ×3 IMPLANT
DRSG AQUACEL AG ADV 3.5X 4 (GAUZE/BANDAGES/DRESSINGS) IMPLANT
DRSG AQUACEL AG ADV 3.5X 6 (GAUZE/BANDAGES/DRESSINGS) ×3 IMPLANT
DURAPREP 26ML APPLICATOR (WOUND CARE) ×3 IMPLANT
DURASEAL SPINE SEALANT 3ML (MISCELLANEOUS) IMPLANT
ELECT BLADE TIP CTD 4 INCH (ELECTRODE) IMPLANT
ELECT REM PT RETURN 9FT ADLT (ELECTROSURGICAL) ×3
ELECTRODE REM PT RTRN 9FT ADLT (ELECTROSURGICAL) ×1 IMPLANT
GLOVE BIOGEL PI IND STRL 7.5 (GLOVE) ×1 IMPLANT
GLOVE BIOGEL PI INDICATOR 7.5 (GLOVE) ×2
GLOVE SURG SS PI 7.5 STRL IVOR (GLOVE) ×3 IMPLANT
GLOVE SURG SS PI 8.0 STRL IVOR (GLOVE) ×6 IMPLANT
GOWN STRL REUS W/TWL XL LVL3 (GOWN DISPOSABLE) ×6 IMPLANT
IV CATH 14GX2 1/4 (CATHETERS) ×3 IMPLANT
KIT BASIN OR (CUSTOM PROCEDURE TRAY) ×3 IMPLANT
KIT POSITIONING SURG ANDREWS (MISCELLANEOUS) ×3 IMPLANT
MANIFOLD NEPTUNE II (INSTRUMENTS) ×3 IMPLANT
NEEDLE SPNL 18GX3.5 QUINCKE PK (NEEDLE) ×6 IMPLANT
PACK LAMINECTOMY ORTHO (CUSTOM PROCEDURE TRAY) ×3 IMPLANT
PATTIES SURGICAL .5 X.5 (GAUZE/BANDAGES/DRESSINGS) IMPLANT
PATTIES SURGICAL .75X.75 (GAUZE/BANDAGES/DRESSINGS) IMPLANT
PATTIES SURGICAL 1X1 (DISPOSABLE) IMPLANT
PEN SKIN MARKING BROAD (MISCELLANEOUS) ×3 IMPLANT
SPONGE SURGIFOAM ABS GEL 100 (HEMOSTASIS) ×3 IMPLANT
STAPLER VISISTAT (STAPLE) ×3 IMPLANT
STRIP CLOSURE SKIN 1/2X4 (GAUZE/BANDAGES/DRESSINGS) IMPLANT
SUT NURALON 4 0 TR CR/8 (SUTURE) IMPLANT
SUT PROLENE 3 0 PS 2 (SUTURE) IMPLANT
SUT VIC AB 1 CT1 27 (SUTURE)
SUT VIC AB 1 CT1 27XBRD ANTBC (SUTURE) IMPLANT
SUT VIC AB 1-0 CT2 27 (SUTURE) ×6 IMPLANT
SUT VIC AB 2-0 CT1 27 (SUTURE) ×4
SUT VIC AB 2-0 CT1 TAPERPNT 27 (SUTURE) ×2 IMPLANT
SUT VIC AB 2-0 CT2 27 (SUTURE) IMPLANT
SYR 3ML LL SCALE MARK (SYRINGE) ×3 IMPLANT
TOWEL OR 17X26 10 PK STRL BLUE (TOWEL DISPOSABLE) ×3 IMPLANT
TOWEL OR NON WOVEN STRL DISP B (DISPOSABLE) ×3 IMPLANT
YANKAUER SUCT BULB TIP NO VENT (SUCTIONS) ×3 IMPLANT

## 2014-12-07 NOTE — H&P (View-Only) (Signed)
Travis SabaRufus [name] is an 81 y.o. male.   Chief Complaint: back and B/L leg pain HPI: The patient is a 81 year old male who presents today for follow up of their back. The patient is being followed for their low back symptoms. They are now year(s) out from when symptoms began. Symptoms reported today include: pain. Current treatment includes: activity modification and pain medications. The following medication has been used for pain control: Ultram. The patient presents today following MRI and ESI left L3-4 x 8 days. The patient reports the injection did not help. The patient indicates that they have questions or concerns today regarding surgery.  Travis SabaRufus Erxleben presents here with his family. He still reports an inability to ambulate. He has to sit. He does have some pain radiating down the leg even when he sits. He thinks it is in the back and also in the front.  Review of systems is negative for fevers, chest pain, shortness of breath, unexplained recent weight loss, loss of bowel or bladder function, burning with urination, joint swelling, rashes, weakness or numbness, difficulty with balance, easy bruising, excessive thirst or frequent urination.  He has been cleared by his cardiologist and his medical physician. His sugars are well controlled.  No past medical history on file.  No past surgical history on file.  No family history on file. Social History:  has no tobacco, alcohol, and drug history on file.  Allergies: Allergies not on file   (Not in a hospital admission)  No results found for this or any previous visit (from the past 48 hour(s)). No results found.  Review of Systems  Constitutional: Negative.   HENT: Negative.   Eyes: Negative.   Respiratory: Negative.   Cardiovascular: Negative.   Gastrointestinal: Negative.   Genitourinary: Negative.   Musculoskeletal: Positive for back pain.  Skin: Negative.   Neurological: Positive for focal weakness.  Psychiatric/Behavioral:  Negative.     There were no vitals taken for this visit. Physical Exam  Constitutional: He is oriented to person, place, and time. He appears well-developed and well-nourished.  HENT:  Head: Normocephalic and atraumatic.  Eyes: Conjunctivae and EOM are normal. Pupils are equal, round, and reactive to light.  Neck: Normal range of motion. Neck supple.  Cardiovascular: Normal rate and regular rhythm.   Respiratory: Effort normal and breath sounds normal.  GI: Soft. Bowel sounds are normal.  Musculoskeletal:  On exam, he walks with a forward flexed antalgic gait. Straight leg raise is lying flat, pain on the right. He has quads weakness bilaterally. Limited flexion and extension. No instability of hips, knees, and ankles.  Neurological: He is alert and oriented to person, place, and time. He has normal reflexes.  Skin: Skin is warm and dry.  Psychiatric: He has a normal mood and affect.    His x-rays demonstrate previous decompression, laminectomies of L5.  His MRI demonstrates severe stenosis at 3-4, moderate lateral recess at 4-5, and at T3. He has a cyst at 5-1 that it is unchanged.  Assessment/Plan Spinal stenosis 1. Neurogenic claudication. 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 Family and he would like to proceed with decompression. I had an extensive discussion of the risks and benefits of the lumbar decompression with the patient including bleeding, infection, damage to neurovascular structures, epidural fibrosis, CSF leak requiring repair. We also discussed increase in pain, adjacent segment disease, recurrent disc herniation, need for future surgery including repeat decompression and/or fusion. We  also discussed risks of postoperative hematoma, paralysis, anesthetic complications including DVT, PE, death, cardiopulmonary dysfunction. In addition, the perioperative and postoperative courses were discussed in detail  including the rehabilitative time and return to functional activity and work. I provided the patient with an illustrated handout and utilized the appropriate surgical models.  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 Also indicated that predominance of his symptoms are at 3-4 of the stenosis. We will try to decompress 4-5, but there was excessive scar tissue from his previous surgery and I would preclude further exploration intraoperatively based upon the risk, same for the cyst at L5-S1. I do not believe he is symptomatic from that given that was present on his previous studies when these symptoms were not present.  Plan lumbar decompression L2-3, L3-4, possible L4-5  BISSELL, JACLYN M. PA-C for Dr. Shelle IronBeane 11/21/2014, 9:12 AM

## 2014-12-07 NOTE — Brief Op Note (Signed)
12/07/2014  11:08 AM  PATIENT:  Ellin Sabaufus Ran  81 y.o. male  PRE-OPERATIVE DIAGNOSIS:  SPINAL STENOSIS L3-L4 L2-L3 L4-L5   POST-OPERATIVE DIAGNOSIS:  SPINAL STENOSIS L3-L4 L2-L3 L4-L5   PROCEDURE:  Procedure(s): DECOMPRESSION L3-L4 L2-L3,discectomy L3,4   (N/A)  SURGEON:  Surgeon(s) and Role:    * Jene EveryJeffrey Kwane Rohl, MD - Primary  PHYSICIAN ASSISTANT:   ASSISTANTS: [place]   ANESTHESIA:   general  EBL:  Total I/O In: -  Out: 350 [Urine:300; Blood:50]  BLOOD ADMINISTERED:none  DRAINS: none   LOCAL MEDICATIONS USED:  MARCAINE     SPECIMEN:  Source of Specimen:  [id number]  DISPOSITION OF SPECIMEN:  PATHOLOGY  COUNTS:  YES  TOURNIQUET:  * No tourniquets in log *  DICTATION: .Other Dictation: Dictation Number [id number]  PLAN OF CARE: Admit to inpatient   PATIENT DISPOSITION:  PACU - hemodynamically stable.   Delay start of Pharmacological VTE agent (>24hrs) due to surgical blood loss or risk of bleeding: yes

## 2014-12-07 NOTE — Progress Notes (Signed)
Dr Shelle IronBeane notified of pharmacy's concern for elevated BUN/Creatine and Robaxin.

## 2014-12-07 NOTE — Progress Notes (Signed)
Pharmacy contacted RN and advised cautious administration of Robaxin due to lab work.

## 2014-12-07 NOTE — Consult Note (Signed)
[facility] Medical Consultation  Ellin SabaRufus Karim ZOX:[id number] DOB: 1931/01/18 DOA: 12/07/2014 PCP: No primary care provider on file.   Requesting physician: Dr. Shelle IronBeane Date of consultation: 12/07/14 Reason for consultation: medical management   Impression/Recommendations Principal Problem:   Spinal stenosis of lumbar region Active Problems:   Lumbar spinal stenosis   81 y/o male with PMH of HTN, DM, CAD h/o CABG, CHF, ? CKD, Chronic L sided weakness, Spinal stenosis who underwent decompression surgery at L3-L4 L2-L3,discectomy L3,4 today. Post op: hospitalist is asked for medical management.   1. Spinal stenosis. s/p Decompression surgery at L3-L4 L2-L3,discectomy L3,4; per surgery   2. DM. unclear history. Will hold humalog ordered as 20 BID. I called his wife. She reports that he is on humulin  BID (35u-25U). will start humulin 10 BID+ ISS for today due to poor oral intake.  Check ha1c 3. HTN. Uncontrolled. Resume home regimen, prn hydralazine. Adjust as needed  4. CAD h/o CABG. He denies any cardiopulmonary symptoms. Resume BB, statin, ASA if okay with surgery  -h/o CHF. clinically euvolemic. Resume lasix in AM 5. ? CKD. Check BMP.   I will followup again tomorrow. Please contact me if I can be of assistance in the meanwhile. Thank you for this consultation.  Chief Complaint: back pains   HPI:  81 y/o male with PMH of HTN, DM, CAD h/o CABG, CHF, ? CKD, Chronic L sided weakness, Spinal stenosis who underwent decompression surgery at L3-L4 L2-L3,discectomy L3,4 today. Post op: hospitalist is asked for medical management.  -patient is in SDU. No distress. He denies chest pains, no SOB, no abdominal pains, he report mild post op pain. He reports chronic L sided weakness. But  denies any new focal neurological weakness.    Review of Systems:  Review of Systems  Constitutional: Negative for fever, chills and diaphoresis.  HENT: Negative for congestion, ear pain and tinnitus.    Eyes: Negative for blurred vision, double vision and photophobia.  Respiratory: Negative for cough, hemoptysis and sputum production.   Cardiovascular: Negative for chest pain, orthopnea, claudication and leg swelling.  Gastrointestinal: Negative for nausea, vomiting and constipation.  Genitourinary: Negative for dysuria and urgency.  Musculoskeletal: Negative for myalgias.  Skin: Negative for rash.  Neurological: Negative for dizziness, tingling, sensory change, speech change, seizures and headaches.  Psychiatric/Behavioral: Negative for depression.     Past Medical History  Diagnosis Date  . Hypertension   . Coronary artery disease   . Myocardial infarction   . Diabetes mellitus without complication   . Sleep apnea     no cpap used, couldn't tolerate  . Chronic kidney disease     some kidney filtering issues-now showing improvement  . CHF (congestive heart failure)   . Presence of permanent cardiac pacemaker     [place] - 05-10-13 [employer]" bradycardia" RBBB  . Back pain     'spinal stenosis", s/p herniation disc surgery  . Arthritis     arthritis of spine, DDD  . Fracture of femur, trochanteric     left  . Stroke   . Diverticulosis     past history Diverticulitis  . GERD (gastroesophageal reflux disease)   . Elevated cholesterol   . Neuromuscular disorder     neuropathy legs/feet   Past Surgical History  Procedure Laterality Date  . Aortic valve replacement      7'13  . Coronary artery bypass graft      x1 - 7'13  in [place]  . Back surgery  x1 lumbar  . Colon surgery      colon resection due to inflammation "diverticulitis"  . Appendectomy    . Hernia repair    . Coronary angioplasty      x 1 stent   Social History:  reports that he quit smoking about 26 years ago. His smoking use included Cigarettes. He does not have any smokeless tobacco history on file. He reports that he does not drink alcohol or use illicit drugs.  No Known  Allergies History reviewed. No pertinent family history.  Prior to Admission medications   Medication Sig Start Date End Date Taking? Authorizing Provider  amLODipine (NORVASC) 5 MG tablet Take 5 mg by mouth daily.   Yes Historical Provider, MD  aspirin EC 81 MG tablet Take 81 mg by mouth daily.   Yes Historical Provider, MD  atorvastatin (LIPITOR) 80 MG tablet Take 80 mg by mouth at bedtime.   Yes Historical Provider, MD  insulin lispro (HUMALOG) 100 UNIT/ML injection Inject 20-30 Units into the skin 2 (two) times daily.   Yes Historical Provider, MD  metoprolol tartrate (LOPRESSOR) 25 MG tablet Take 25 mg by mouth 2 (two) times daily.   Yes Historical Provider, MD  pantoprazole (PROTONIX) 40 MG tablet Take 40 mg by mouth daily.   Yes Historical Provider, MD  traMADol (ULTRAM) 50 MG tablet Take 50 mg by mouth every 6 (six) hours as needed for moderate pain.   Yes Historical Provider, MD  albuterol (PROVENTIL HFA;VENTOLIN HFA) 108 (90 BASE) MCG/ACT inhaler Inhale 2 puffs into the lungs every 6 (six) hours as needed for wheezing or shortness of breath.    Historical Provider, MD  diazepam (VALIUM) 10 MG tablet Take 10 mg by mouth every 6 (six) hours as needed for anxiety.    Historical Provider, MD  docusate sodium (COLACE) 100 MG capsule Take 1 capsule (100 mg total) by mouth 2 (two) times daily as needed for mild constipation. 12/07/14   Jene Every, MD  furosemide (LASIX) 40 MG tablet Take 40 mg by mouth daily as needed for fluid.    Historical Provider, MD  HYDROcodone-acetaminophen (NORCO/VICODIN) 5-325 MG per tablet Take 1 tablet by mouth every 4 (four) hours as needed. 12/07/14   Jene Every, MD   Physical Exam: Blood pressure 134/60, pulse 70, temperature 97.5 F (36.4 C), temperature source Oral, resp. rate 11, height  (1.676 m), weight 91 kg (200 lb 9.9 oz), SpO2 95 %. Filed Vitals:   12/07/14 1300  BP: 134/60  Pulse: 70  Temp:   Resp: 11     General:  Alert, oriented. No  distress   Eyes: EOm-i   ENT: no oral ulcers   Neck: supple, no JVD   Cardiovascular: s1,s2 rrr  Respiratory: CTA BL  Abdomen: soft, NT, ND  Skin: no rash   Musculoskeletal: no leg edema  Psychiatric: no hallucinations   Neurologic: CN 2-12 intact. Motor 5 / 5 BL  Labs on Admission:  Basic Metabolic Panel: No results for input(s): NA, K, CL, CO2, GLUCOSE, BUN, CREATININE, CALCIUM, MG, PHOS in the last 168 hours. Liver Function Tests: No results for input(s): AST, ALT, ALKPHOS, BILITOT, PROT, ALBUMIN in the last 168 hours. No results for input(s): LIPASE, AMYLASE in the last 168 hours. No results for input(s): AMMONIA in the last 168 hours. CBC: No results for input(s): WBC, NEUTROABS, HGB, HCT, MCV, PLT in the last 168 hours. Cardiac Enzymes: No results for input(s): CKTOTAL, CKMB, CKMBINDEX, TROPONINI in the  last 168 hours. BNP: Invalid input(s): POCBNP CBG:  Recent Labs Lab 12/07/14 0623 12/07/14 1137  GLUCAP 190* 204*    Radiological Exams on Admission: Dg Spine Portable 1 View  12/07/2014   CLINICAL DATA:  Lumbar decompressions surgery.  EXAM: PORTABLE SPINE - 1 VIEW  COMPARISON:  Intraoperative radiograph earlier today.  FINDINGS: Single portable cross table lateral intraoperative radiograph of the lumbar spine is provided. Tissue retractors are now present and predominantly project over the L3 spinous process, with additional thin metallic components on either side of the blades projecting just inferior to the L2 spinous process and over the L4 spinous process.  IMPRESSION: Intraoperative localization during lumbar spine surgery.   Electronically Signed   By: Sebastian AcheAllen  Grady   On: 12/07/2014 09:43   Dg Spine Portable 1 View  12/07/2014   CLINICAL DATA:  Lumbar decompression  EXAM: PORTABLE SPINE - 1 VIEW  COMPARISON:  Lumbar spine 11/29/2014  FINDINGS: Five lumbar segments.  The lowest disc space is L5-S1  Surgical localization was performed. There is a needle  between the spinous processes of L2 and L3. There is a second needle directed towards the L3-L4 interspinous space.  IMPRESSION: L2-3 and L3-4 interspinous spaces localized with needles in the operating room.   Electronically Signed   By: Marlan Palauharles  Clark M.D.   On: 12/07/2014 09:27    EKG: Independently reviewed. Not done   Time spent: >45 minutes   Esperanza SheetsBURIEV, Kobyn Kray N [facility] Pager [phone]  If 7PM-7AM, please contact night-coverage [email or web address] Password [email or web address] 12/07/2014, 1:30 PM

## 2014-12-07 NOTE — Anesthesia Procedure Notes (Signed)
Procedure Name: Intubation Date/Time: 12/07/2014 8:54 AM Performed by: Paulla DollyJOYCE, Talulah Schirmer A Pre-anesthesia Checklist: Patient identified, Timeout performed, Emergency Drugs available, Suction available and Patient being monitored Patient Re-evaluated:Patient Re-evaluated prior to inductionOxygen Delivery Method: Circle system utilized Preoxygenation: Pre-oxygenation with 100% oxygen Intubation Type: IV induction Ventilation: Mask ventilation without difficulty Laryngoscope Size: Mac and 4 Grade View: Grade I Tube type: Oral Tube size: 8.0 mm Number of attempts: 1 Airway Equipment and Method: Stylet Placement Confirmation: ETT inserted through vocal cords under direct vision,  positive ETCO2 and breath sounds checked- equal and bilateral Secured at: 22 cm Tube secured with: Tape Dental Injury: Teeth and Oropharynx as per pre-operative assessment

## 2014-12-07 NOTE — Care Management Note (Signed)
Case Management Note  Patient Details  Name: Travis [name] MRN: [id number] Date of Birth: 1930/12/13  Subjective/Objective:         Unstable patient after spinal surg.           Action/Plan: Will follow for needs   Expected Discharge Date:  12/10/14               Expected Discharge Plan:  Home/Self Care  In-House Referral:  NA  Discharge planning Services  CM Consult  Post Acute Care Choice:  NA Choice offered to:  Patient  DME Arranged:    DME Agency:     HH Arranged:    HH Agency:     Status of Service:  In process, will continue to follow  Medicare Important Message Given:    Date Medicare IM Given:    Medicare IM give by:    Date Additional Medicare IM Given:    Additional Medicare Important Message give by:     If discussed at Long Length of Stay Meetings, dates discussed:    Additional Comments:  Golda AcreDavis, Jamita Mckelvin Lynn, RN 12/07/2014, 1:42 PM

## 2014-12-07 NOTE — Discharge Instructions (Signed)
Walk As Tolerated utilizing back precautions.  No bending, twisting, or lifting.  No driving for 2 weeks.   Aquacel dressing may remain in place until follow up. May shower with aquacel dressing in place. If the dressing peels off or becomes saturated, you may remove aquacel dressing and place gauze and tape dressing which should be kept clean and dry and changed daily. Do not remove steri-strips if they are present. See Dr. Shelle IronBeane in office in 2 weeks. Begin taking aspirin 81mg  per day starting 4 days after your surgery if not allergic to aspirin or on another blood thinner. Walk daily even outside. Use a cane or walker only if necessary. Avoid sitting on soft sofas.

## 2014-12-07 NOTE — Transfer of Care (Signed)
Immediate Anesthesia Transfer of Care Note  Patient: Travis [name]  Procedure(s) Performed: Procedure(s): DECOMPRESSION L3-L4 L2-L3,discectomy L3,4   (N/A)  Patient Location: PACU  Anesthesia Type:General  Level of Consciousness: awake, sedated and patient cooperative  Airway & Oxygen Therapy: Patient Spontanous Breathing and Patient connected to face mask oxygen  Post-op Assessment: Report given to RN and Post -op Vital signs reviewed and stable  Post vital signs: Reviewed and stable  Last Vitals:  Filed Vitals:   12/07/14 0621  BP: 176/82  Pulse: 77  Temp: 36.7 C  Resp: 16    Complications: No apparent anesthesia complications

## 2014-12-07 NOTE — Interval H&P Note (Signed)
History and Physical Interval Note:  12/07/2014 8:15 AM  Travis [name]  has presented today for surgery, with the diagnosis of SPINAL STENOSIS L3-L4 L2-L3 L4-L5   The various methods of treatment have been discussed with the patient and family. After consideration of risks, benefits and other options for treatment, the patient has consented to  Procedure(s): DECOMPRESSION L3-L4 L2-L3 POSSIBLE L4-L5   ( 3 LEVELS)  (N/A) as a surgical intervention .  The patient's history has been reviewed, patient examined, no change in status, stable for surgery.  I have reviewed the patient's chart and labs.  Questions were answered to the patient's satisfaction.     Leota Maka C

## 2014-12-07 NOTE — Progress Notes (Signed)
Pt was dizzy and nauseous despite medication today. Nursing judgement not to ambulate at this time.

## 2014-12-07 NOTE — Progress Notes (Signed)
Accucheck: 337, likely missed insulin AM. Will cont ISS, increased NPH to 20 BID. Cont monitor, adjust as needed Travis [name] N

## 2014-12-07 NOTE — Anesthesia Preprocedure Evaluation (Addendum)
Anesthesia Evaluation  Patient identified by MRN, date of birth, ID band Patient awake    Reviewed: Allergy & Precautions, NPO status , Patient's Chart, lab work & pertinent test results  Airway Mallampati: II  TM Distance: >3 FB Neck ROM: Full    Dental no notable dental hx.    Pulmonary neg pulmonary ROS, former smoker,  breath sounds clear to auscultation  Pulmonary exam normal       Cardiovascular hypertension, + CAD, + Past MI and + CABG Normal cardiovascular exam+ pacemaker Rhythm:Regular Rate:Normal     Neuro/Psych CVA negative psych ROS   GI/Hepatic negative GI ROS, Neg liver ROS,   Endo/Other  negative endocrine ROSdiabetes  Renal/GU Renal InsufficiencyRenal disease  negative genitourinary   Musculoskeletal negative musculoskeletal ROS (+)   Abdominal   Peds negative pediatric ROS (+)  Hematology negative hematology ROS (+)   Anesthesia Other Findings   Reproductive/Obstetrics negative OB ROS                           Anesthesia Physical Anesthesia Plan  ASA: III  Anesthesia Plan: General   Post-op Pain Management:    Induction: Intravenous  Airway Management Planned: Oral ETT  Additional Equipment: Arterial line  Intra-op Plan:   Post-operative Plan: Extubation in OR  Informed Consent: I have reviewed the patients History and Physical, chart, labs and discussed the procedure including the risks, benefits and alternatives for the proposed anesthesia with the patient or authorized representative who has indicated his/her understanding and acceptance.   Dental advisory given  Plan Discussed with: CRNA and Surgeon  Anesthesia Plan Comments:        Anesthesia Quick Evaluation

## 2014-12-08 ENCOUNTER — Encounter (HOSPITAL_COMMUNITY): Payer: Self-pay | Admitting: Specialist

## 2014-12-08 DIAGNOSIS — E1365 Other specified diabetes mellitus with hyperglycemia: Secondary | ICD-10-CM

## 2014-12-08 LAB — CBC
HCT: 33.6 % — ABNORMAL LOW (ref 39.0–52.0)
Hemoglobin: 11.5 g/dL — ABNORMAL LOW (ref 13.0–17.0)
MCH: 29.9 pg (ref 26.0–34.0)
MCHC: 34.2 g/dL (ref 30.0–36.0)
MCV: 87.3 fL (ref 78.0–100.0)
PLATELETS: 142 10*3/uL — AB (ref 150–400)
RBC: 3.85 MIL/uL — ABNORMAL LOW (ref 4.22–5.81)
RDW: 14.9 % (ref 11.5–15.5)
WBC: 11 10*3/uL — AB (ref 4.0–10.5)

## 2014-12-08 LAB — BASIC METABOLIC PANEL
ANION GAP: 6 (ref 5–15)
BUN: 27 mg/dL — AB (ref 6–20)
CHLORIDE: 104 mmol/L (ref 101–111)
CO2: 24 mmol/L (ref 22–32)
CREATININE: 1.52 mg/dL — AB (ref 0.61–1.24)
Calcium: 8.4 mg/dL — ABNORMAL LOW (ref 8.9–10.3)
GFR, EST AFRICAN AMERICAN: 48 mL/min — AB (ref >60)
GFR, EST NON AFRICAN AMERICAN: 41 mL/min — AB (ref >60)
Glucose, Bld: 216 mg/dL — ABNORMAL HIGH (ref 70–99)
Potassium: 4.9 mmol/L (ref 3.5–5.1)
Sodium: 134 mmol/L — ABNORMAL LOW (ref 135–145)

## 2014-12-08 LAB — HEMOGLOBIN A1C
HEMOGLOBIN A1C: 8 % — AB (ref 4.8–5.6)
MEAN PLASMA GLUCOSE: 183 mg/dL

## 2014-12-08 LAB — GLUCOSE, CAPILLARY
GLUCOSE-CAPILLARY: 153 mg/dL — AB (ref 70–99)
Glucose-Capillary: 174 mg/dL — ABNORMAL HIGH (ref 70–99)
Glucose-Capillary: 213 mg/dL — ABNORMAL HIGH (ref 70–99)
Glucose-Capillary: 129 mg/dL — ABNORMAL HIGH (ref 70–99)

## 2014-12-08 MED ORDER — INSULIN NPH (HUMAN) (ISOPHANE) 100 UNIT/ML ~~LOC~~ SUSP
30.0000 [IU] | Freq: Every day | SUBCUTANEOUS | Status: DC
  Administered 2014-12-08 – 2014-12-10 (×3): 30 [IU] via SUBCUTANEOUS
  Filled 2014-12-08: qty 10

## 2014-12-08 MED ORDER — ASPIRIN EC 81 MG PO TBEC: 81.0000 mg | DELAYED_RELEASE_TABLET | Freq: Every day | ORAL | Status: AC

## 2014-12-08 MED ORDER — INSULIN NPH (HUMAN) (ISOPHANE) 100 UNIT/ML ~~LOC~~ SUSP
25.0000 [IU] | Freq: Every day | SUBCUTANEOUS | Status: DC
Start: 2014-12-08 — End: 2014-12-10
  Administered 2014-12-08 – 2014-12-09 (×2): 25 [IU] via SUBCUTANEOUS

## 2014-12-08 MED ORDER — INSULIN NPH (HUMAN) (ISOPHANE) 100 UNIT/ML ~~LOC~~ SUSP: 30.0000 [IU] | Freq: Every day | SUBCUTANEOUS | Status: DC

## 2014-12-08 NOTE — Progress Notes (Signed)
Patient continues to desire minimal po intake, although not as nauseated as earlier.  CBG elevated despite no caloric intake of po's, IVF throughout pm.  NPH given as ordered at bedtime.  Merdis DelayK. Schorr NP notified.  No other insulin orders given.  Will continue to monitor.

## 2014-12-08 NOTE — Consult Note (Signed)
[facility] PROGRESS NOTE  Travis [name] ZOX:[id number] DOB: Oct 15, 1930 DOA: 12/07/2014 PCP: No primary care provider on file.  Assessment/Plan:  81 y/o male with PMH of HTN, DM, CAD h/o CABG, CHF, ? CKD, Chronic L sided weakness, Spinal stenosis who underwent decompression surgery at L3-L4 L2-L3,discectomy L3,4 today. Post op: hospitalist is asked for medical management.   1. Spinal stenosis. s/p Decompression surgery at L3-L4 L2-L3,discectomy L3,4; per surgery   2. DM. unclear history. Now patient reports taking humulin  BID (35u-25U).  -check AM glucose. Restart humulin at 30 AM 25 PM due to poor oral intake. Cont ISS.  Check ha1c. Will resume home regimen at discharge if stable  3. HTN. Uncontrolled on admission. BP improved after resuming home regimen, prn hydralazine.  4. CAD h/o CABG. He denies any cardiopulmonary symptoms. Resume BB, statin, ASA if okay with surgery  -h/o CHF. clinically euvolemic. Resume lasix as needed  5. CKD. Renal function seems stable.    D/c plans per primary    Please contact me if I can be of assistance in the meanwhile. Thank you for this consultation.  Code Status: full Family Communication: d/w patient (indicate person spoken with, relationship, and if by phone, the number) Disposition Plan: per primary    Consultants:  none  Procedures:  Decompression as above   Antibiotics:  periop  (indicate start date, and stop date if known)  HPI/Subjective: Alert, oriented   Objective: Filed Vitals:   12/08/14 0700  BP:   Pulse: 71  Temp:   Resp: 13    Intake/Output Summary (Last 24 hours) at 12/08/14 0748 Last data filed at 12/08/14 0700  Gross per 24 hour  Intake 2586.67 ml  Output   1055 ml  Net 1531.67 ml   Filed Weights   12/07/14 0700 12/07/14 1300 12/08/14 0700  Weight: 91.343 kg (201 lb 6 oz) 91 kg (200 lb 9.9 oz) 95 kg (209 lb 7 oz)    Exam:   General:  No distress. Had some nausea, but no vomiting    Cardiovascular: s1,s2 rrr  Respiratory: CTA ABL  Abdomen: soft, NT  Musculoskeletal: no leg edema   Data Reviewed: Basic Metabolic Panel:  Recent Labs Lab 12/07/14 1438 12/08/14 0340  NA 140 134*  K 4.9 4.9  CL 105 104  CO2 25 24  GLUCOSE 314* 216*  BUN 24* 27*  CREATININE 1.59* 1.52*  CALCIUM 8.7* 8.4*   Liver Function Tests: No results for input(s): AST, ALT, ALKPHOS, BILITOT, PROT, ALBUMIN in the last 168 hours. No results for input(s): LIPASE, AMYLASE in the last 168 hours. No results for input(s): AMMONIA in the last 168 hours. CBC:  Recent Labs Lab 12/08/14 0340  WBC 11.0*  HGB 11.5*  HCT 33.6*  MCV 87.3  PLT 142*   Cardiac Enzymes: No results for input(s): CKTOTAL, CKMB, CKMBINDEX, TROPONINI in the last 168 hours. BNP (last 3 results) No results for input(s): BNP in the last 8760 hours.  ProBNP (last 3 results) No results for input(s): PROBNP in the last 8760 hours.  CBG:  Recent Labs Lab 12/07/14 0623 12/07/14 1137 12/07/14 1636 12/07/14 2141  GLUCAP 190* 204* 337* 264*    Recent Results (from the past 240 hour(s))  Surgical pcr screen     Status: None   Collection Time: 11/29/14  9:45 AM  Result Value Ref Range Status   MRSA, PCR NEGATIVE NEGATIVE Final   Staphylococcus aureus NEGATIVE NEGATIVE Final    Comment:  The Xpert SA Assay (FDA approved for NASAL specimens in patients over 79 years of age), is one component of a comprehensive surveillance program.  Test performance has been validated by [facility] for patients greater than or equal to 79 year old. It is not intended to diagnose infection nor to guide or monitor treatment.      Studies: Dg Spine Portable 1 View  12/07/2014   CLINICAL DATA:  Lumbar decompressions surgery.  EXAM: PORTABLE SPINE - 1 VIEW  COMPARISON:  Intraoperative radiograph earlier today.  FINDINGS: Single portable cross table lateral intraoperative radiograph of the lumbar spine is provided.  Tissue retractors are now present and predominantly project over the L3 spinous process, with additional thin metallic components on either side of the blades projecting just inferior to the L2 spinous process and over the L4 spinous process.  IMPRESSION: Intraoperative localization during lumbar spine surgery.   Electronically Signed   By: Sebastian AcheAllen  Grady   On: 12/07/2014 09:43   Dg Spine Portable 1 View  12/07/2014   CLINICAL DATA:  Lumbar decompression  EXAM: PORTABLE SPINE - 1 VIEW  COMPARISON:  Lumbar spine 11/29/2014  FINDINGS: Five lumbar segments.  The lowest disc space is L5-S1  Surgical localization was performed. There is a needle between the spinous processes of L2 and L3. There is a second needle directed towards the L3-L4 interspinous space.  IMPRESSION: L2-3 and L3-4 interspinous spaces localized with needles in the operating room.   Electronically Signed   By: Marlan Palauharles  Clark M.D.   On: 12/07/2014 09:27    Scheduled Meds: . acidophilus  1 capsule Oral Daily  . amLODipine  5 mg Oral Daily  .  ceFAZolin (ANCEF) IV  2 g Intravenous Q8H  . docusate sodium  100 mg Oral BID  . insulin aspart  0-15 Units Subcutaneous TID WC  . insulin NPH Human  25 Units Subcutaneous QHS  . [START ON 12/09/2014] insulin NPH Human  30 Units Subcutaneous QAC breakfast  . metoprolol tartrate  25 mg Oral BID  . pantoprazole  40 mg Oral Daily   Continuous Infusions: . sodium chloride 50 mL/hr at 12/07/14 1428    Principal Problem:   Spinal stenosis of lumbar region Active Problems:   Lumbar spinal stenosis    Time spent: >35 minutes     Esperanza SheetsBURIEV, Dragan Tamburrino N  [facility] Pager [phone]. If 7PM-7AM, please contact night-coverage at [email or web address], password [email or web address] 12/08/2014, 7:48 AM  LOS: 1 day

## 2014-12-08 NOTE — Evaluation (Addendum)
Physical Therapy Evaluation Patient Details Name: Travis SabaRufus [name] MRN: [id number] DOB: March 24, 1931 Today's Date: 12/08/2014   History of Present Illness  81 yo male s/p L3-L5 decompression. hx of neurogenic claudication, spinal stenosis, chronic L sided weakness.   Clinical Impression  On eval, pt required Min assist +2 for mobility-able to ambulate ~75 feet with RW. Reviewed back precautions and logroll technique. Pt tolerated activity well.     Follow Up Recommendations Home health PT; 24 hour supervision/assist. (SNF if family is unable to assist.)    Equipment Recommendations  None recommended by PT    Recommendations for Other Services OT consult     Precautions / Restrictions Precautions Precautions: Back Precaution Comments: reviewed back precautions Restrictions Weight Bearing Restrictions: No      Mobility  Bed Mobility Overal bed mobility: Needs Assistance Bed Mobility: Sit to Sidelying         Sit to sidelying: Min assist;+2 for physical assistance General bed mobility comments: cues for technique.  guided trunk and assisted with legs  Transfers Overall transfer level: Needs assistance Equipment used: Rolling walker (2 wheeled) Transfers: Sit to/from Stand Sit to Stand: Mod assist         General transfer comment: cues for hand placement and back precautions. Assist to rise, stabilize, control descent  Ambulation/Gait   Ambulation Distance (Feet): 75 Feet Assistive device: Rolling walker (2 wheeled) Gait Pattern/deviations: Step-through pattern;Decreased stride length     General Gait Details: assist to stabilize  Stairs            Wheelchair Mobility    Modified Rankin (Stroke Patients Only)       Balance                                             Pertinent Vitals/Pain Pain Assessment: 0-10 Pain Score: 4  Pain Location: back Pain Descriptors / Indicators: Sore Pain Intervention(s): Monitored during  session;Repositioned    Home Living Family/patient expects to be discharged to:: Private residence Living Arrangements: Spouse/significant other;Other relatives;Children Available Help at Discharge: Family Type of Home: House         Home Equipment:  (walker, ? 2 vs. 4 wheel)      Prior Function Level of Independence: Independent with assistive device(s)               Hand Dominance        Extremity/Trunk Assessment   Upper Extremity Assessment: Defer to OT evaluation           Lower Extremity Assessment: Generalized weakness (hx of chronic weakness)         Communication   Communication: HOH  Cognition Arousal/Alertness: Awake/alert Behavior During Therapy: WFL for tasks assessed/performed Overall Cognitive Status: Within Functional Limits for tasks assessed                      General Comments      Exercises        Assessment/Plan    PT Assessment Patient needs continued PT services  PT Diagnosis Difficulty walking;Acute pain   PT Problem List Decreased activity tolerance;Decreased balance;Decreased mobility;Pain;Decreased knowledge of precautions  PT Treatment Interventions DME instruction;Gait training;Functional mobility training;Therapeutic activities;Therapeutic exercise;Patient/family education;Balance training   PT Goals (Current goals can be found in the Care Plan section) Acute Rehab PT Goals Patient Stated Goal: none stated; agreeable to therapy PT  Goal Formulation: With patient Time For Goal Achievement: 12/22/14 Potential to Achieve Goals: Good    Frequency Min 5X/week   Barriers to discharge        Co-evaluation PT/OT/SLP Co-Evaluation/Treatment: Yes Reason for Co-Treatment: For patient/therapist safety PT goals addressed during session: Mobility/safety with mobility;Balance;Proper use of DME OT goals addressed during session: ADL's and self-care       End of Session   Activity Tolerance: Patient tolerated  treatment well Patient left: in bed;with call bell/phone within reach;with family/visitor present           Time: 4782-95621358-1417 PT Time Calculation (min) (ACUTE ONLY): 19 min   Charges:   PT Evaluation $Initial PT Evaluation Tier I: 1 Procedure     PT G Codes:        Rebeca AlertJannie Nhan [name], MPT Pager: [phone]

## 2014-12-08 NOTE — Progress Notes (Signed)
Subjective: 1 Day Post-Op Procedure(s) (LRB): DECOMPRESSION L3-L4 L2-L3,discectomy L3,4   (N/A) Patient reports pain as mild.  Reports pain well controlled. Leg pain is better. Foley still in place. OOB sitting in chair this morning, tolerating well. Seen by myself and Dr. Shelle IronBeane  Objective: Vital signs in last 24 hours: Temp:  [97.3 F (36.3 C)-97.9 F (36.6 C)] 97.6 F (36.4 C) (05/05 0327) Pulse Rate:  [59-77] 77 (05/05 0957) Resp:  [7-26] 16 (05/05 0957) BP: (110-187)/(41-77) 115/41 mmHg (05/04 2116) SpO2:  [94 %-100 %] 98 % (05/05 0957) Arterial Line BP: (116-197)/(43-82) 166/65 mmHg (05/05 0957) Weight:  [91 kg (200 lb 9.9 oz)-95 kg (209 lb 7 oz)] 95 kg (209 lb 7 oz) (05/05 0700)  Intake/Output from previous day: 05/04 0701 - 05/05 0700 In: 2586.7 [P.O.:220; I.V.:2211.7; IV Piggyback:155] Out: 1055 [Urine:1005; Blood:50] Intake/Output this shift: Total I/O In: 50 [I.V.:50] Out: -    Recent Labs  12/08/14 0340  HGB 11.5*    Recent Labs  12/08/14 0340  WBC 11.0*  RBC 3.85*  HCT 33.6*  PLT 142*    Recent Labs  12/07/14 1438 12/08/14 0340  NA 140 134*  K 4.9 4.9  CL 105 104  CO2 25 24  BUN 24* 27*  CREATININE 1.59* 1.52*  GLUCOSE 314* 216*  CALCIUM 8.7* 8.4*   No results for input(s): LABPT, INR in the last 72 hours.  Neurologically intact ABD soft Neurovascular intact Sensation intact distally Intact pulses distally Dorsiflexion/Plantar flexion intact Incision: dressing C/D/I and no drainage No cellulitis present Compartment soft no calf pain or sign of DVT  Assessment/Plan: 1 Day Post-Op Procedure(s) (LRB): DECOMPRESSION L3-L4 L2-L3,discectomy L3,4   (N/A) Advance diet Up with therapy D/C IV fluids  Glycemic control per hospitalists- improved this AM Foley out today- bladder scan if unable to void 6 h after removal Ok to remove A-line today Incentive spirometry Ok to transfer to floor later today if remains stable, voiding well, and  ok with hospitalists Plan possible D/C home tomorrow depending on progress  BISSELL, JACLYN M. 12/08/2014, 10:39 AM

## 2014-12-08 NOTE — Evaluation (Signed)
Occupational Therapy Evaluation Patient Details Name: Travis [name] MRN: [id number] DOB: 10/23/1930 Today's Date: 12/08/2014    History of Present Illness 81 yo male s/p L3-L5 decompression. hx of neurogenic claudication, spinal stenosis, chronic L sided weakness.    Clinical Impression   Pt was admitted for the above surgery.  Pt was mod I prior to admission.  Pt required A x 2 for safety with ambulation (had A line) and up to max A for LB adls.  Will follow in acute with supervision to min guard level goals    Follow Up Recommendations  Home health OT vs SNF (depending upon progress and available help)    Equipment Recommendations   (to be further assessed:  has high commode/nothing to push up)    Recommendations for Other Services       Precautions / Restrictions Precautions Precautions: Back Precaution Comments: reviewed back precautions Restrictions Weight Bearing Restrictions: No      Mobility Bed Mobility Overal bed mobility: Needs Assistance Bed Mobility: Sit to Sidelying         Sit to sidelying: Min assist;+2 for physical assistance General bed mobility comments: cues for technique.  guided trunk and assisted with legs  Transfers Overall transfer level: Needs assistance Equipment used: Rolling walker (2 wheeled) Transfers: Sit to/from Stand Sit to Stand: Mod assist         General transfer comment: cues for hand placement and back precautions. Assist to rise, stabilize, control descent    Balance                                            ADL Overall ADL's : Needs assistance/impaired     Grooming: Set up;Sitting   Upper Body Bathing: Set up;Sitting   Lower Body Bathing: Moderate assistance;Sit to/from stand   Upper Body Dressing : Set up;Sitting   Lower Body Dressing: Maximal assistance;Sit to/from stand   Toilet Transfer: Minimal assistance;+2 for safety/equipment (chair to bed)   Toileting- Clothing Manipulation  and Hygiene: Minimal assistance;Sit to/from stand         General ADL Comments: pt is used to bending forward for LB adls.  He has a son in law who lives 10 minutes away, who has back issues also.  Pt used to have a reacher:  he is not sure he still has it.       Vision     Perception     Praxis      Pertinent Vitals/Pain Pain Assessment: 0-10 Pain Score: 4  Pain Location: back Pain Descriptors / Indicators: Sore Pain Intervention(s): Monitored during session;Repositioned     Hand Dominance     Extremity/Trunk Assessment Upper Extremity Assessment Upper Extremity Assessment: Defer to OT evaluation          Communication Communication Communication: HOH   Cognition Arousal/Alertness: Awake/alert Behavior During Therapy: WFL for tasks assessed/performed Overall Cognitive Status: Within Functional Limits for tasks assessed                     General Comments       Exercises       Shoulder Instructions      Home Living Family/patient expects to be discharged to:: Private residence Living Arrangements: Spouse/significant other;Other relatives;Children Available Help at Discharge: Family Type of Home: House             Bathroom  Shower/Tub: Producer, television/film/videoWalk-in shower   Bathroom Toilet: Handicapped height     Home Equipment:  (walker, ? 2 vs. 4 wheel)          Prior Functioning/Environment Level of Independence: Independent with assistive device(s)             OT Diagnosis: Generalized weakness   OT Problem List: Decreased strength;Decreased activity tolerance;Pain;Decreased knowledge of use of DME or AE;Decreased knowledge of precautions   OT Treatment/Interventions: Self-care/ADL training;DME and/or AE instruction;Patient/family education    OT Goals(Current goals can be found in the care plan section) Acute Rehab OT Goals Patient Stated Goal: none stated; agreeable to therapy OT Goal Formulation: With patient Time For Goal Achievement:  12/15/14 Potential to Achieve Goals: Good ADL Goals Pt Will Transfer to Toilet: with supervision;ambulating;bedside commode (vs high commode) Pt Will Perform Toileting - Clothing Manipulation and hygiene: with supervision;sit to/from stand Pt Will Perform Tub/Shower Transfer: Shower transfer;with min guard assist;ambulating;shower seat Additional ADL Goal #1: pt will verbalize 3/3 back precautions Additional ADL Goal #2: pt will demonstrate LB bathing and donning pants with reacher vs. verbalize understanding  OT Frequency: Min 2X/week   Barriers to D/C:            Co-evaluation PT/OT/SLP Co-Evaluation/Treatment: Yes Reason for Co-Treatment: For patient/therapist safety PT goals addressed during session: Mobility/safety with mobility;Balance;Proper use of DME OT goals addressed during session: ADL's and self-care      End of Session    Activity Tolerance: Patient tolerated treatment well;No increased pain Patient left: in bed;with call bell/phone within reach   Time: 1355-1414 OT Time Calculation (min): 19 min Charges:  OT General Charges $OT Visit: 1 Procedure OT Evaluation $Initial OT Evaluation Tier I: 1 Procedure G-Codes:    Lettie Czarnecki 12/08/2014, 2:47 PM  Marica OtterMaryellen Zhaire Locker, OTR/L [phone] 12/08/2014

## 2014-12-08 NOTE — Op Note (Signed)
NAMReece Packer:  [name], Travis [name]               ACCOUNT NO.:  [id number]  MEDICAL RECORD NO.:  [id number]  LOCATION:  1222                         [facility]  PHYSICIAN:  Travis [name], M.D.    DATE OF BIRTH:  30-Jan-1931  DATE OF PROCEDURE:  12/07/2014 DATE OF DISCHARGE:                              OPERATIVE REPORT   PREOPERATIVE DIAGNOSIS:  Spinal stenosis, herniated nucleus pulposus, 3- 4, 4-5.  POSTOPERATIVE DIAGNOSIS:  Spinal stenosis, herniated nucleus pulposus, 3- 4, 4-5.  PROCEDURE PERFORMED:  Lumbar decompression, 3-4, 4-5; bilateral foraminotomies, L3, L4, L5 on left.  ANESTHESIA:  General.  ASSISTANT:  Travis PocheJacqueline Bissell, PA  HISTORY:  This is an 81 year old with neurogenic claudication secondary to spinal stenosis.  He had severe stenosis at 3-4, had multiple injections, epidural without avail, HNP progressive, compressed on the 4 roots.  It was indicated for decompression at possibly 2-3 and 4-5, history of decompression at 5-1, and laminectomy until 4-5 area.  He had some lateral recess stenosis at 4-5, but the predominance of problems at 3-4, he had inability to walk well, severe pain.  Discussed with the family, risks and benefits, given his age and comorbidities, as well as lasting conservative treatment with severe pain.  We discussed proceeding with decompression.  Risks and benefits discussed including bleeding, infection, damage to neurovascular structures, DVT, PE, anesthetic complications, etc.  TECHNIQUE:  The patient in supine position, after induction of adequate general anesthesia, 2 g Kefzol, placed prone on the [place] frame.  All bony prominences were well padded.  Lumbar region was prepped and draped in usual sterile fashion.  Two 18-gauge spinal needle was utilized to localize 3-4, 4-5 interspaces confirmed with x-ray.  Incision was made from the spinous process above 3 to below 4.  Subcutaneous tissue was dissected.  Electrocautery was utilized to  achieve hemostasis.  A 0.25% Marcaine with epinephrine was infiltrated, very spinous tissue. Confirmatory radiograph obtained.  McCullough retractor was placed. Operating microscope was draped, brought on the surgical field.  Leksell rongeur was utilized to remove the spinous process 3, the remaining of 4.  We started first in the interlaminar space at 3-4 centrally with a 2 mm Kerrison, entering the space where ligamentum flavum once. Centrally, there was very small interlaminar window.  We performed a central laminotomy of 3 and bilateral hemilaminotomies of 3.  He had severe central and lateral recess stenosis at 3-4.  Meticulously decompressed the lateral recesses to the medial border of the pedicle performing foraminotomies of 3 and 4.  This is from both sides of the operating room table.  There was ligamentum flavum, hypertrophy and facet hypertrophy generating severe central and lateral recess particularly on the left.  After identifying the 4 root and 3 root on the left, in the thecal sac and it's lateral edge, we identified a focal HNP.  Annulotomy was performed and removed copious portion of disk material from the disk space with a straight pituitary laterally and medially just to the pedicle and then medially with micropituitary, further mobilized with a nerve hook.  There were multiple fragments that were removed.  There was some cephalad and caudad from the disk space,  irrigated the disk  space with antibiotic irrigation.  The catheter lavaged, removed a few additional fragments.  Continued with the decompression distally, the symptoms were mainly on the left.  A lateral recess at 4-5.  We continued to perform foraminotomies bilaterally, hemilaminectomy on the left into the 4-5 space.  I decompressed the lateral recess to the medial border of the pedicle bilaterally and a foraminotomy of 5.  There was hypertrophic ligamentum and facet hypertrophy.  There was  previous decompression to the 4-5 space.  Bipolar electrocautery was utilized to achieve hemostasis.  There was tapering of the thecal sac centrally down distally, so continued to perform the decompression distally.  Following this, was good restoration of thecal sac.  Neural probe passed freely up the foramen of 4 and 5.  Good restoration of the thecal sac.  We performed intraoperative Valsalva, good restoration of the thecal sac. There was no active CSF leakage.  We obtained a confirmatory radiograph with a neural probe out the foramen of 5 and cephalad above the disk space at 3-4.  Bone wax placed on the cancellous surfaces.  Bipolar electrocautery was utilized to achieve hemostasis, copiously irrigated the wound.  Thrombin-soaked Gelfoam was placed in the laminotomy defect. No active bleeding.  We removed the [facility] Foundation[facility] retractor, irrigated the paraspinous musculature, closed the dorsolumbar fascia with multiple 1 Vicryl in a figure-of-eight sutures, subcu with 2-0, no active bleeding. Skin with staples.  Sterile dressing applied, placed supine on hospital bed, extubated without difficulty, and transported to the recovery room in satisfactory condition.  The patient tolerated the procedure well.  No complications.  Assistant, Travis [name], [place]. We lost approximately 50 mL of blood.  No complications.     Travis [name], M.D.     Travis PenJB/MEDQ  D:  12/07/2014  T:  12/07/2014  Job:  [id number]

## 2014-12-08 NOTE — Progress Notes (Signed)
Pt transferred to 1222 via wheelchair with all belongings. Wife and sister notified of transfer. Report given to receiving RN and all questions answered.

## 2014-12-08 NOTE — Anesthesia Postprocedure Evaluation (Signed)
  Anesthesia Post-op Note  Patient: Travis [name]  Procedure(s) Performed: Procedure(s) (LRB): DECOMPRESSION L3-L4 L2-L3,discectomy L3,4   (N/A)  Patient Location: PACU  Anesthesia Type: General  Level of Consciousness: awake and alert   Airway and Oxygen Therapy: Patient Spontanous Breathing  Post-op Pain: mild  Post-op Assessment: Post-op Vital signs reviewed, Patient's Cardiovascular Status Stable, Respiratory Function Stable, Patent Airway and No signs of Nausea or vomiting  Last Vitals:  Filed Vitals:   12/08/14 0800  BP:   Pulse: 69  Temp:   Resp: 16    Post-op Vital Signs: stable   Complications: No apparent anesthesia complications

## 2014-12-09 DIAGNOSIS — M4806 Spinal stenosis, lumbar region: Principal | ICD-10-CM

## 2014-12-09 LAB — GLUCOSE, CAPILLARY
GLUCOSE-CAPILLARY: 87 mg/dL (ref 70–99)
GLUCOSE-CAPILLARY: 102 mg/dL — AB (ref 70–99)
Glucose-Capillary: 138 mg/dL — ABNORMAL HIGH (ref 70–99)
Glucose-Capillary: 92 mg/dL (ref 70–99)

## 2014-12-09 LAB — BASIC METABOLIC PANEL
ANION GAP: 8 (ref 5–15)
BUN: 26 mg/dL — AB (ref 6–20)
CO2: 25 mmol/L (ref 22–32)
Calcium: 8.8 mg/dL — ABNORMAL LOW (ref 8.9–10.3)
Chloride: 105 mmol/L (ref 101–111)
Creatinine, Ser: 1.69 mg/dL — ABNORMAL HIGH (ref 0.61–1.24)
GFR calc non Af Amer: 36 mL/min — ABNORMAL LOW (ref >60)
GFR, EST AFRICAN AMERICAN: 42 mL/min — AB (ref >60)
Glucose, Bld: 86 mg/dL (ref 70–99)
Potassium: 4.3 mmol/L (ref 3.5–5.1)
Sodium: 138 mmol/L (ref 135–145)

## 2014-12-09 MED ORDER — FUROSEMIDE 40 MG PO TABS
40.0000 mg | ORAL_TABLET | Freq: Every day | ORAL | Status: DC
  Administered 2014-12-10: 40 mg via ORAL
  Filled 2014-12-09 (×2): qty 1

## 2014-12-09 NOTE — Care Management Note (Signed)
Case Management Note  Patient Details  Name: Travis [name] MRN: [id number] Date of Birth: 11/16/1930  Subjective/Objective:             s/p L3-L5 decompression. hx of neurogenic claudication, spinal stenosis, chronic L sided weakness       Action/Plan: Discharge planning  Expected Discharge Date:  12/10/14               Expected Discharge Plan:  Home/Self Care  In-House Referral:  NA, Clinical Social Work  Discharge planning Services  CM Consult  Post Acute Care Choice:  NA Choice offered to:  Patient  DME Arranged:    DME Agency:     HH Arranged:    HH Agency:     Status of Service:  Completed, signed off  Medicare Important Message Given:  N/A - LOS <3 / Initial given by admissions Date Medicare IM Given:    Medicare IM give by:    Date Additional Medicare IM Given:    Additional Medicare Important Message give by:     If discussed at Long Length of Stay Meetings, dates discussed:    Additional Comments: Spoke with patient twice this am, PT evaluations are recommending HHPT. Patient states he does not want PT, does not think it is necessary, states wife does not like people coming to his home. He states there are 5 people in his home to care for him, his son-in-law, his daughter, his wife, and his son who is mentally handicapped. Asked if I could contact his family to confirm, he states he will discuss with them when they come to visit today. Updated bedside nurse. Travis [name], Tadashi Burkel K, RN 12/09/2014, 11:54 AM

## 2014-12-09 NOTE — Progress Notes (Signed)
Occupational Therapy Treatment Patient Details Name: Travis SabaRufus [name] MRN: [id number] DOB: Dec 31, 1930 Today's Date: 12/09/2014    History of present illness 81 yo male s/p L3-L5 decompression. hx of neurogenic claudication, spinal stenosis, chronic L sided weakness.    OT comments  Pt needs min A/guarding for ambulating as he is a little unsteady (but did not have LOB).  Pt verbalizes back precautions.  Min cues for safety, especially with shower transfer.  Pt is used to 4 wheel walker:  Cues for stepping into RW  Follow Up Recommendations  Home health OT;Supervision/Assistance - 24 hour (needs family to guard him; help with LB adls)    Equipment Recommendations  3 in 1 bedside comode    Recommendations for Other Services      Precautions / Restrictions Precautions Precautions: Back Precaution Comments: pt recalled 3/3 back precautions Restrictions Weight Bearing Restrictions: No  PT IS HOH     Mobility Bed Mobility               General bed mobility comments: pt up in chair  Transfers   Equipment used: Rolling walker (2 wheeled) Transfers: Sit to/from Stand Sit to Stand: Min assist;Min guard         General transfer comment: cues for UE placement.  Light assist to rise once and close guard once also assist to steady    Balance                                   ADL               Lower Body Bathing: Minimal assistance;Sit to/from stand;With adaptive equipment           Toilet Transfer: Minimal assistance;Ambulation;BSC       Tub/ Shower Transfer: Minimal assistance;Ambulation;3 in 1;Walk-in shower     General ADL Comments: Performed bathing and educated on reacher to assist with LB ADLs--did not practice dressing.  Pt states he has wife, a daughter, 2 sons (one has MR) and a grandson in the home. Pt needed cues for safety with shower transfer.   Educated that he will need someone to walk with him when he walks as he is unsteady.  He  tends to sway side to side.  Pt is used to a 4 wheel walker:  cued to step into RW to avoid bending.        Vision                     Perception     Praxis      Cognition   Behavior During Therapy: WFL for tasks assessed/performed Overall Cognitive Status: Within Functional Limits for tasks assessed                       Extremity/Trunk Assessment               Exercises     Shoulder Instructions       General Comments      Pertinent Vitals/ Pain       Pain Score: 6  Pain Location: back Pain Descriptors / Indicators: Sore Pain Intervention(s): Limited activity within patient's tolerance;Monitored during session;Premedicated before session;Repositioned  Home Living  Prior Functioning/Environment              Frequency Min 2X/week     Progress Toward Goals  OT Goals(current goals can now be found in the care plan section)  Progress towards OT goals: Progressing toward goals  Acute Rehab OT Goals Time For Goal Achievement: 12/15/14  Plan      Co-evaluation                 End of Session     Activity Tolerance Patient tolerated treatment well   Patient Left in chair;with call bell/phone within reach   Nurse Communication          Time: [id number] OT Time Calculation (min): 32 min  Charges: OT General Charges $OT Visit: 1 Procedure OT Treatments $Self Care/Home Management : 23-37 mins  Joslin Doell 12/09/2014, 10:09 AM  Marica OtterMaryellen Karletta Millay, OTR/L [phone] 12/09/2014

## 2014-12-09 NOTE — Progress Notes (Signed)
Subjective: 2 Days Post-Op Procedure(s) (LRB): DECOMPRESSION L3-L4 L2-L3,discectomy L3,4   (N/A) Patient reports pain as 3 on 0-10 scale.    Objective: Vital signs in last 24 hours: Temp:  [97.7 F (36.5 C)-98.1 F (36.7 C)] 97.9 F (36.6 C) (05/06 0515) Pulse Rate:  [60-77] 60 (05/06 0515) Resp:  [13-20] 20 (05/06 0515) BP: (124-168)/(46-79) 148/64 mmHg (05/06 0515) SpO2:  [94 %-100 %] 94 % (05/06 0515) Arterial Line BP: (120-180)/(53-65) 120/53 mmHg (05/05 1400) Weight:  [93.5 kg (206 lb 2.1 oz)-95 kg (209 lb 7 oz)] 93.5 kg (206 lb 2.1 oz) (05/06 0515)  Intake/Output from previous day: 05/05 0701 - 05/06 0700 In: 880 [P.O.:480; I.V.:400] Out: 1825 [Urine:1825] Intake/Output this shift: Total I/O In: 0  Out: 875 [Urine:875]   Recent Labs  12/08/14 0340  HGB 11.5*    Recent Labs  12/08/14 0340  WBC 11.0*  RBC 3.85*  HCT 33.6*  PLT 142*    Recent Labs  12/08/14 0340 12/09/14 0455  NA 134* 138  K 4.9 4.3  CL 104 105  CO2 24 25  BUN 27* 26*  CREATININE 1.52* 1.69*  GLUCOSE 216* 86  CALCIUM 8.4* 8.8*   No results for input(s): LABPT, INR in the last 72 hours.  Neurologically intact ABD soft Neurovascular intact Dorsiflexion/Plantar flexion intact Incision: dressing C/D/I  Assessment/Plan: 2 Days Post-Op Procedure(s) (LRB): DECOMPRESSION L3-L4 L2-L3,discectomy L3,4   (N/A) Advance diet Up with therapy D/C IV fluids  Possible d/C instr given. Medicine to see. Gluc better controlled.  Travis [name] C 12/09/2014, 6:54 AM

## 2014-12-09 NOTE — Progress Notes (Signed)
Physical Therapy Treatment Patient Details Name: Travis [name] MRN: [id number] DOB: 27-Mar-1931 Today's Date: 12/09/2014    History of Present Illness 81 yo male s/p L3-L5 decompression. hx of neurogenic claudication, spinal stenosis, chronic L sided weakness.     PT Comments    Progressing with mobility. Pain rated ~4/10 with activity on today. Discussed d/c plan-pt states he has several family members around. No family present during session so still unsure of how much assistance/supervision they can provide. Pt also states that he has to talk with his wife first before agreeing to HHPT.   Follow Up Recommendations  Home health PT;Supervision/Assistance - 24 hour (if pt will agree to it. he states he will discuss it with his wife)     Equipment Recommendations  None recommended by PT    Recommendations for Other Services       Precautions / Restrictions Precautions Precautions: Back Precaution Comments: pt recalled 3/3 back precautions Restrictions Weight Bearing Restrictions: No    Mobility  Bed Mobility Overal bed mobility: Needs Assistance Bed Mobility: Sit to Sidelying         Sit to sidelying: Mod assist General bed mobility comments: Assist for bil LE and for trunk positioning. Increased time. VCs safety, technique.   Transfers Overall transfer level: Needs assistance Equipment used: Rolling walker (2 wheeled) Transfers: Sit to/from Stand Sit to Stand: Min guard         General transfer comment: close guard for safety. VCs safety, hand placement  Ambulation/Gait Ambulation/Gait assistance: Min guard Ambulation Distance (Feet): 115 Feet Assistive device: Rolling walker (2 wheeled) Gait Pattern/deviations: Step-through pattern     General Gait Details: close guard for safety.    Stairs            Wheelchair Mobility    Modified Rankin (Stroke Patients Only)       Balance                                    Cognition  Arousal/Alertness: Awake/[name] Behavior During Therapy: WFL for tasks assessed/performed Overall Cognitive Status: Within Functional Limits for tasks assessed                      Exercises      General Comments        Pertinent Vitals/Pain Pain Assessment: 0-10 Pain Score: 4  Pain Location: back Pain Descriptors / Indicators: Sore Pain Intervention(s): Monitored during session;Repositioned    Home Living                      Prior Function            PT Goals (current goals can now be found in the care plan section) Progress towards PT goals: Progressing toward goals    Frequency  Min 5X/week    PT Plan Current plan remains appropriate    Co-evaluation             End of Session Equipment Utilized During Treatment: Gait belt Activity Tolerance: Patient tolerated treatment well Patient left: in bed;with call bell/phone within reach;with bed alarm set     Time: [id number] PT Time Calculation (min) (ACUTE ONLY): 17 min  Charges:  $Gait Training: 8-22 mins                    G Codes:      Travis AlertJannie Andrey [name], MPT  Pager: [phone]

## 2014-12-09 NOTE — Progress Notes (Signed)
Patient ID: Travis [name] Hoots, male   DOB: 03/08/1931, 81 y.o.   MRN: [id number]  [facility] PROGRESS NOTE  Travis [name] Koker [id number] DOB: 03/08/1931 DOA: 12/07/2014 PCP: No primary care provider on file.   Brief narrative:    81 y/o male with PMH of HTN, DM, CAD h/o CABG, CHF, ? CKD, Chronic L sided weakness, Spinal stenosis who underwent decompression surgery at L3-L4 L2-L3, discectomy L3,4 on 5/4. Post op: hospitalist is asked for medical management.  Assessment/Plan:    1. Spinal stenosis.- s/p Decompression surgery at L3-L4 L2-L3, discectomy L3,4; per surgery   2. DM on insulin at home humulin BID 35 U in AM and 25 U PM, A1C is 8 on this admission and there is a potential that if HgA1C < 7.5 on next check, he could be transitioned to oral regimen if PCP agrees   3. HTN. Home medical regimen resumed, SBP in 150's, added Hydralazine as needed for better blood pressure control   4. CAD h/o CABG. He denies any cardiopulmonary symptoms. Resumed BB, statin, ASA if okay with surgery   5. H/o diastolic CHF. clinically euvolemic. No 2 D ECHO on file. Weight is trending down: 209 --> 206 lbs, resume Lasix 40 mg PO QD, monitor weight daily and strict I/O  5. CKD. Renal function seems stable. Repeat BMP in AM  6. Obesity - Body mass index is 33.29  DVT prophylaxis - SCD  Code Status: Full.  Family Communication:  plan of care discussed with the patient Disposition Plan: D/C per primary team   IV access:  Peripheral IV  Procedures and diagnostic studies:    Dg Lumbar Spine 2-3 Views 11/29/2014   Degenerative disc space narrowing and facet joint hypertrophy as described. There is no compression fracture or spondylolisthesis.    Dg Spine Portable 1 View  12/07/2014  Intraoperative localization during lumbar spine surgery.    Dg Spine Portable 1 View  12/07/2014    L2-3 and L3-4 interspinous spaces localized with needles in the operating room.     Medical Consultants:  Ortho    Other Consultants:  PT  IAnti-Infectives:   None  Debbora PrestoMAGICK-Arnika Larzelere, MD  [facility] Pager [phone]  If 7PM-7AM, please contact night-coverage [email or web address] Password [email or web address] 12/09/2014, 5:13 PM   LOS: 2 days   HPI/Subjective: No events overnight. Still with mild back pain.  Objective: Filed Vitals:   12/08/14 1600 12/08/14 2135 12/09/14 0515 12/09/14 1349  BP: 124/68 129/46 148/64 159/73  Pulse: 70 69 60 70  Temp:  97.7 F (36.5 C) 97.9 F (36.6 C) 97.9 F (36.6 C)  TempSrc:  Oral Oral Oral  Resp: 15 20 20 16   Height:      Weight:   93.5 kg (206 lb 2.1 oz)   SpO2: 95% 100% 94% 97%    Intake/Output Summary (Last 24 hours) at 12/09/14 1713 Last data filed at 12/09/14 1300  Gross per 24 hour  Intake    720 ml  Output   1715 ml  Net   -995 ml    Exam:   General:  Pt is alert, follows commands appropriately, not in acute distress  Cardiovascular: Regular rate and rhythm, no rubs, no gallops  Respiratory: Clear to auscultation bilaterally, no wheezing, no crackles, no rhonchi  Abdomen: Soft, non tender, non distended, bowel sounds present, no guarding  Extremities: pulses DP and PT palpable bilaterally  Neuro: Grossly nonfocal  Data Reviewed: Basic Metabolic Panel:  Recent Labs Lab 12/07/14 1438 12/08/14 0340 12/09/14 0455  NA 140 134* 138  K 4.9 4.9 4.3  CL 105 104 105  CO2 25 24 25   GLUCOSE 314* 216* 86  BUN 24* 27* 26*  CREATININE 1.59* 1.52* 1.69*  CALCIUM 8.7* 8.4* 8.8*   CBC:  Recent Labs Lab 12/08/14 0340  WBC 11.0*  HGB 11.5*  HCT 33.6*  MCV 87.3  PLT 142*   CBG:  Recent Labs Lab 12/08/14 1118 12/08/14 1710 12/08/14 2138 12/09/14 0456 12/09/14 1129  GLUCAP 213* 153* 129* 87 138*     Scheduled Meds: . acidophilus  1 capsule Oral Daily  . amLODipine  5 mg Oral Daily  . docusate sodium  100 mg Oral BID  . insulin aspart  0-15 Units Subcutaneous TID WC  . insulin NPH Human  25 Units Subcutaneous QHS  . insulin NPH Human   30 Units Subcutaneous QAC breakfast  . metoprolol tartrate  25 mg Oral BID  . pantoprazole  40 mg Oral Daily   Continuous Infusions:

## 2014-12-10 LAB — CBC
HEMATOCRIT: 36.9 % — AB (ref 39.0–52.0)
Hemoglobin: 12.4 g/dL — ABNORMAL LOW (ref 13.0–17.0)
MCH: 29.3 pg (ref 26.0–34.0)
MCHC: 33.6 g/dL (ref 30.0–36.0)
MCV: 87.2 fL (ref 78.0–100.0)
Platelets: 147 10*3/uL — ABNORMAL LOW (ref 150–400)
RBC: 4.23 MIL/uL (ref 4.22–5.81)
RDW: 14.9 % (ref 11.5–15.5)
WBC: 10.1 10*3/uL (ref 4.0–10.5)

## 2014-12-10 LAB — BASIC METABOLIC PANEL
Anion gap: 8 (ref 5–15)
BUN: 28 mg/dL — AB (ref 6–20)
CO2: 28 mmol/L (ref 22–32)
CREATININE: 1.57 mg/dL — AB (ref 0.61–1.24)
Calcium: 9.3 mg/dL (ref 8.9–10.3)
Chloride: 104 mmol/L (ref 101–111)
GFR calc non Af Amer: 40 mL/min — ABNORMAL LOW (ref >60)
GFR, EST AFRICAN AMERICAN: 46 mL/min — AB (ref >60)
GLUCOSE: 125 mg/dL — AB (ref 70–99)
Potassium: 4.1 mmol/L (ref 3.5–5.1)
Sodium: 140 mmol/L (ref 135–145)

## 2014-12-10 LAB — GLUCOSE, CAPILLARY
Glucose-Capillary: 110 mg/dL — ABNORMAL HIGH (ref 70–99)
Glucose-Capillary: 216 mg/dL — ABNORMAL HIGH (ref 70–99)

## 2014-12-10 MED ORDER — INSULIN LISPRO 100 UNIT/ML ~~LOC~~ SOLN: 20.0000 [IU] | Freq: Two times a day (BID) | SUBCUTANEOUS | Status: DC

## 2014-12-10 NOTE — Progress Notes (Addendum)
Physical Therapy Treatment Patient Details Name: Travis SabaRufus Lenig MRN: [id number] DOB: [birth year] Today's Date: 12/10/2014    History of Present Illness 81 yo male s/p L3-L5 decompression. hx of neurogenic claudication, spinal stenosis, chronic L sided weakness.     PT Comments    Pt progressing well, practiced with cane shorter distances since pt can't use RW in bathroom at home; pt did well, discussed session with pt family;   Follow Up Recommendations  Home health PT;Supervision/Assistance - 24 hour;No PT follow up-depending on home support and progress/pt desires     Equipment Recommendations  None recommended by PT;Other (comment) (pt to get cane for use in bedroom)    Recommendations for Other Services       Precautions / Restrictions Precautions Precautions: Back Precaution Comments: pt recalled 3/3 back precautions with subtle cues Restrictions Weight Bearing Restrictions: No    Mobility  Bed Mobility Overal bed mobility: Needs Assistance Bed Mobility: Sit to Sidelying         Sit to sidelying: Min assist General bed mobility comments: Assist for bil LE and for trunk positioning. Increased time. VCs safety, technique.   Transfers Overall transfer level: Needs assistance Equipment used: Rolling walker (2 wheeled) Transfers: Sit to/from Stand Sit to Stand: Supervision         General transfer comment: close guard for safety. VCs safety, hand placement  Ambulation/Gait Ambulation/Gait assistance: Supervision Ambulation Distance (Feet): 260 Feet Assistive device: Rolling walker (2 wheeled);Straight cane Gait Pattern/deviations: Step-through pattern;Trunk flexed;Wide base of support;Decreased stride length     General Gait Details: for safety   Stairs      up/down 3 stairs with HHA, min/guard and cues for safety, sequence      Wheelchair Mobility    Modified Rankin (Stroke Patients Only)       Balance                                     Cognition Arousal/Alertness: Awake/alert Behavior During Therapy: WFL for tasks assessed/performed Overall Cognitive Status: Within Functional Limits for tasks assessed                      Exercises      General Comments        Pertinent Vitals/Pain Pain Assessment: 0-10 Pain Score: 1  Pain Location: back Pain Descriptors / Indicators: Sore Pain Intervention(s): Limited activity within patient's tolerance;Monitored during session;Repositioned    Home Living                      Prior Function            PT Goals (current goals can now be found in the care plan section) Acute Rehab PT Goals Patient Stated Goal: none stated; agreeable to therapy PT Goal Formulation: With patient Time For Goal Achievement: 12/22/14 Potential to Achieve Goals: Good Progress towards PT goals: Progressing toward goals    Frequency  Min 5X/week    PT Plan Current plan remains appropriate    Co-evaluation             End of Session Equipment Utilized During Treatment: Gait belt Activity Tolerance: Patient tolerated treatment well Patient left: in bed;with call bell/phone within reach;with family/visitor present     Time: [id number] PT Time Calculation (min) (ACUTE ONLY): 27 min  Charges:  $Gait Training: 23-37 mins  G CodesDrucilla Chalet:      Meyli Boice 12/10/2014, 11:11 AM

## 2014-12-10 NOTE — Progress Notes (Signed)
Subjective: Awake and alert with family. Very comfortable and wants to go home. Passing gas and belly feels OK.   Objective: Vital signs in last 24 hours: Temp:  [97.9 F (36.6 C)-98.5 F (36.9 C)] 98.5 F (36.9 C) (05/07 0420) Pulse Rate:  [68-72] 68 (05/07 0420) Resp:  [16-18] 18 (05/07 0420) BP: (156-161)/(66-73) 161/68 mmHg (05/07 0420) SpO2:  [96 %-99 %] 96 % (05/07 0420) Weight:  [93.5 kg (206 lb 2.1 oz)] 93.5 kg (206 lb 2.1 oz) (05/07 0641)  Intake/Output from previous day: 05/06 0701 - 05/07 0700 In: 840 [P.O.:840] Out: 1640 [Urine:1640] Intake/Output this shift: Total I/O In: 480 [P.O.:480] Out: -    Recent Labs  12/08/14 0340 12/10/14 0425  HGB 11.5* 12.4*    Recent Labs  12/08/14 0340 12/10/14 0425  WBC 11.0* 10.1  RBC 3.85* 4.23  HCT 33.6* 36.9*  PLT 142* 147*    Recent Labs  12/09/14 0455 12/10/14 0425  NA 138 140  K 4.3 4.1  CL 105 104  CO2 25 28  BUN 26* 28*  CREATININE 1.69* 1.57*  GLUCOSE 86 125*  CALCIUM 8.8* 9.3   No results for input(s): LABPT, INR in the last 72 hours.  Sensation intact distally Intact pulses distally Dorsiflexion/Plantar flexion intact Incision: dressing C/D/I  Assessment/Plan: Doing well. Will D/C home with family.   Travis [name] ANDREW 12/10/2014, 11:35 AM

## 2014-12-10 NOTE — Progress Notes (Addendum)
Patient ID: Travis [name], male   DOB: Dec 10, 1930, 81 y.o.   MRN: [id number]  [facility] PROGRESS NOTE  Travis [name] [id number] DOB: Dec 10, 1930 DOA: 12/07/2014 PCP: Dr. Foye DeerSchultz Douglas     Brief narrative:    81 y/o male with PMH of HTN, DM, CAD h/o CABG, CHF, ? CKD, Chronic L sided weakness, Spinal stenosis who underwent decompression surgery at L3-L4 L2-L3, discectomy L3,4 on 5/4. Post op: hospitalist is asked for medical management.  Assessment/Plan:    1. Spinal stenosis.- s/p Decompression surgery at L3-L4 L2-L3, discectomy L3,4; per surgery. Pt stable for d/c from IM stand point. Med rec completed. Will forward this note to pt's PCP.   2. DM on insulin at home humulin BID 35 U in AM and 25 U PM, A1C is 8 on this admission and there is a potential that if HgA1C < 7.5 on next check, he could be transitioned to oral regimen if PCP agreesTo avoid hypoglycemic events, will lower the dose of Humulin to 25 U am and 20 U pm. Med rec adjusted.   3. HTN. Home medical regimen resumed, Norvasc, metoprolol, lasix as needed per previous home medical regimen   4. CAD h/o CABG. He denies any cardiopulmonary symptoms. Resumed BB, statin, ASA if okay with surgery   5. H/o diastolic CHF. clinically euvolemic. No 2 D ECHO on file. Weight is trending down: 209 --> 206 lbs, resumed Lasix 40 mg PO QD, monitor weight daily and strict I/O  5. CKD. Renal function seems stable. Ct trending down from 1.69 --> 1.57  6. Obesity - Body mass index is 33.29  DVT prophylaxis - SCD  Code Status: Full.  Family Communication:  plan of care discussed with the patient Disposition Plan: D/C per primary team   IV access:  Peripheral IV  Procedures and diagnostic studies:    Dg Lumbar Spine 2-3 Views 11/29/2014   Degenerative disc space narrowing and facet joint hypertrophy as described. There is no compression fracture or spondylolisthesis.    Dg Spine Portable 1 View  12/07/2014  Intraoperative  localization during lumbar spine surgery.    Dg Spine Portable 1 View  12/07/2014    L2-3 and L3-4 interspinous spaces localized with needles in the operating room.     Medical Consultants:  Ortho   Other Consultants:  PT  IAnti-Infectives:   None  Debbora PrestoMAGICK-Alyssa Rotondo, MD  [facility] Pager [phone]  If 7PM-7AM, please contact night-coverage [email or web address] Password [email or web address] 12/10/2014, 11:26 AM   LOS: 3 days   HPI/Subjective: No events overnight. Still with mild back pain but overall better, tolerating diet well  Objective: Filed Vitals:   12/09/14 1349 12/09/14 2105 12/10/14 0420 12/10/14 0641  BP: 159/73 156/66 161/68   Pulse: 70 72 68   Temp: 97.9 F (36.6 C) 98.4 F (36.9 C) 98.5 F (36.9 C)   TempSrc: Oral Oral Oral   Resp: 16 18 18    Height:      Weight:    93.5 kg (206 lb 2.1 oz)  SpO2: 97% 99% 96%     Intake/Output Summary (Last 24 hours) at 12/10/14 1126 Last data filed at 12/10/14 0935  Gross per 24 hour  Intake    960 ml  Output   1200 ml  Net   -240 ml    Exam:   General:  Pt is alert, follows commands appropriately, not in acute distress  Cardiovascular: Regular rate and rhythm, no rubs, no gallops  Respiratory: Clear to auscultation bilaterally, no wheezing,  no crackles, no rhonchi  Abdomen: Soft, non tender, non distended, bowel sounds present, no guarding  Extremities: pulses DP and PT palpable bilaterally  Neuro: Grossly nonfocal  Data Reviewed: Basic Metabolic Panel:  Recent Labs Lab 12/07/14 1438 12/08/14 0340 12/09/14 0455 12/10/14 0425  NA 140 134* 138 140  K 4.9 4.9 4.3 4.1  CL 105 104 105 104  CO2 25 24 25 28   GLUCOSE 314* 216* 86 125*  BUN 24* 27* 26* 28*  CREATININE 1.59* 1.52* 1.69* 1.57*  CALCIUM 8.7* 8.4* 8.8* 9.3   CBC:  Recent Labs Lab 12/08/14 0340 12/10/14 0425  WBC 11.0* 10.1  HGB 11.5* 12.4*  HCT 33.6* 36.9*  MCV 87.3 87.2  PLT 142* 147*   CBG:  Recent Labs Lab 12/09/14 0456 12/09/14 1129  12/09/14 1732 12/09/14 2107 12/10/14 0712  GLUCAP 87 138* 92 102* 110*     Scheduled Meds: . acidophilus  1 capsule Oral Daily  . amLODipine  5 mg Oral Daily  . docusate sodium  100 mg Oral BID  . furosemide  40 mg Oral Daily  . insulin aspart  0-15 Units Subcutaneous TID WC  . insulin NPH Human  25 Units Subcutaneous QHS  . insulin NPH Human  30 Units Subcutaneous QAC breakfast  . metoprolol tartrate  25 mg Oral BID  . pantoprazole  40 mg Oral Daily   Continuous Infusions:

## 2014-12-10 NOTE — Progress Notes (Signed)
Unit RN delivered 3n1 and RW to room prior to pt's dc. [facility]. Isidoro DonningAlesia Vega Stare RN CCM Case Mgmt phone [phone]

## 2014-12-12 DIAGNOSIS — M5441 Lumbago with sciatica, right side: Secondary | ICD-10-CM | POA: Diagnosis not present

## 2014-12-12 DIAGNOSIS — R262 Difficulty in walking, not elsewhere classified: Secondary | ICD-10-CM | POA: Diagnosis not present

## 2014-12-12 DIAGNOSIS — E119 Type 2 diabetes mellitus without complications: Secondary | ICD-10-CM | POA: Diagnosis not present

## 2014-12-12 DIAGNOSIS — Z794 Long term (current) use of insulin: Secondary | ICD-10-CM | POA: Diagnosis not present

## 2014-12-12 DIAGNOSIS — I10 Essential (primary) hypertension: Secondary | ICD-10-CM | POA: Diagnosis not present

## 2014-12-12 NOTE — Discharge Summary (Signed)
Physician Discharge Summary   Patient ID: Travis [name] MRN: [id number] DOB/AGE: [birth year] 79 y.o.  Admit date: 12/07/2014 Discharge date: 12/12/2014  Primary Diagnosis:   SPINAL STENOSIS L3-L4 L2-L3 L4-L5   Admission Diagnoses:  Past Medical History  Diagnosis Date  . Hypertension   . Coronary artery disease   . Myocardial infarction   . Diabetes mellitus without complication   . Sleep apnea     no cpap used, couldn't tolerate  . Chronic kidney disease     some kidney filtering issues-now showing improvement  . CHF (congestive heart failure)   . Presence of permanent cardiac pacemaker     [place] - 05-10-13 [employer]" bradycardia" RBBB  . Back pain     'spinal stenosis", s/p herniation disc surgery  . Arthritis     arthritis of spine, DDD  . Fracture of femur, trochanteric     left  . Stroke   . Diverticulosis     past history Diverticulitis  . GERD (gastroesophageal reflux disease)   . Elevated cholesterol   . Neuromuscular disorder     neuropathy legs/feet   Discharge Diagnoses:   Principal Problem:   Spinal stenosis of lumbar region Active Problems:   Lumbar spinal stenosis  Procedure:  Procedure(s) (LRB): DECOMPRESSION L3-L4 L2-L3,discectomy L3,4   (N/A)   Consults: internal medicine  HPI:  See H&P    Laboratory Data: No results found for any previous visit.  Recent Labs  12/10/14 0425  HGB 12.4*    Recent Labs  12/10/14 0425  WBC 10.1  RBC 4.23  HCT 36.9*  PLT 147*    Recent Labs  12/10/14 0425  NA 140  K 4.1  CL 104  CO2 28  BUN 28*  CREATININE 1.57*  GLUCOSE 125*  CALCIUM 9.3   No results for input(s): LABPT, INR in the last 72 hours.  X-Rays:Dg Lumbar Spine 2-3 Views  11/29/2014   CLINICAL DATA:  Preoperative examination prior to decompression procedures at L2-L3, L3-L4, and possibly L4-L5.  EXAM: LUMBAR SPINE - 2-3 VIEW  COMPARISON:  None.  FINDINGS: There are assumed to be 5 lumbar type non-rib-bearing vertebral  bodies. The vertebral bodies are preserved in height. The pedicles are intact. There is no spondylolisthesis. There is mild disc space narrowing at L5-S1 with minimal narrowing and L2-L3 and L3-L4. There is facet joint hypertrophy at L4-5 and at L5-S1. The observed portions of the sacrum are normal. The patient has appears to have undergone previous laminectomy at L5-S1. There is calcification in the wall of the abdominal aorta.  IMPRESSION: Degenerative disc space narrowing and facet joint hypertrophy as described. There is no compression fracture or spondylolisthesis.   Electronically Signed   By: David  [place] M.D.   On: 11/29/2014 13:37   Dg Spine Portable 1 View  12/07/2014   CLINICAL DATA:  Lumbar decompressions surgery.  EXAM: PORTABLE SPINE - 1 VIEW  COMPARISON:  Intraoperative radiograph earlier today.  FINDINGS: Single portable cross table lateral intraoperative radiograph of the lumbar spine is provided. Tissue retractors are now present and predominantly project over the L3 spinous process, with additional thin metallic components on either side of the blades projecting just inferior to the L2 spinous process and over the L4 spinous process.  IMPRESSION: Intraoperative localization during lumbar spine surgery.   Electronically Signed   By: Sebastian Ache   On: 12/07/2014 09:43   Dg Spine Portable 1 View  12/07/2014   CLINICAL DATA:  Lumbar decompression  EXAM: PORTABLE  SPINE - 1 VIEW  COMPARISON:  Lumbar spine 11/29/2014  FINDINGS: Five lumbar segments.  The lowest disc space is L5-S1  Surgical localization was performed. There is a needle between the spinous processes of L2 and L3. There is a second needle directed towards the L3-L4 interspinous space.  IMPRESSION: L2-3 and L3-4 interspinous spaces localized with needles in the operating room.   Electronically Signed   By: Marlan Palauharles  Clark M.D.   On: 12/07/2014 09:27    EKG:No orders found for this or any previous visit.   Hospital Course: Patient  [facility] and taken to the OR and underwent the above state procedure without complications.  Patient tolerated the procedure well and was later transferred to the recovery room and then to the orthopaedic floor for postoperative care.  They were given PO and IV analgesics for pain control following their surgery.  They were given 24 hours of postoperative antibiotics.   PT was consulted postop to assist with mobility and transfers.  The patient was allowed to be WBAT with therapy and was taught back precautions. Discharge planning was consulted to help with postop disposition and equipment needs.  Patient had a good night on the evening of surgery and started to get up OOB with therapy on day one. He was stable in the step-down unit overnight and transferred to the floor later in the day on POD 1. Patient was seen in rounds daily and was ready to go home on day three.  They were given discharge instructions and dressing directions.  They were instructed on when to follow up in the office with Dr. Shelle IronBeane.   Diet: Diabetic diet Activity:WBAT; Lspine precautions Follow-up:in 10 days Disposition - Home Discharged Condition: good      Medication List    TAKE these medications        albuterol 108 (90 BASE) MCG/ACT inhaler  Commonly known as:  PROVENTIL HFA;VENTOLIN HFA  Inhale 2 puffs into the lungs every 6 (six) hours as needed for wheezing or shortness of breath.     amLODipine 5 MG tablet  Commonly known as:  NORVASC  Take 5 mg by mouth daily.     aspirin EC 81 MG tablet  Take 1 tablet (81 mg total) by mouth daily. Resume 4 days post-op     atorvastatin 80 MG tablet  Commonly known as:  LIPITOR  Take 80 mg by mouth at bedtime.     diazepam 10 MG tablet  Commonly known as:  VALIUM  Take 10 mg by mouth every 6 (six) hours as needed for anxiety.     docusate sodium 100 MG capsule  Commonly known as:  COLACE  Take 1 capsule (100 mg total) by mouth 2 (two)  times daily as needed for mild constipation.     furosemide 40 MG tablet  Commonly known as:  LASIX  Take 40 mg by mouth daily as needed for fluid.     HYDROcodone-acetaminophen 5-325 MG per tablet  Commonly known as:  NORCO/VICODIN  Take 1 tablet by mouth every 4 (four) hours as needed.     insulin lispro 100 UNIT/ML injection  Commonly known as:  HUMALOG  Inject 0.2-0.25 mLs (20-25 Units total) into the skin 2 (two) times daily. Inject 25 units in Am, and 20 units in the pm     metoprolol tartrate 25 MG tablet  Commonly known as:  LOPRESSOR  Take 25 mg by mouth 2 (two) times daily.  pantoprazole 40 MG tablet  Commonly known as:  PROTONIX  Take 40 mg by mouth daily.     traMADol 50 MG tablet  Commonly known as:  ULTRAM  Take 50 mg by mouth every 6 (six) hours as needed for moderate pain.           Follow-up Information    Follow up with BEANE,JEFFREY C, MD In 2 weeks.   Specialty:  Orthopedic Surgery   Why:  For suture removal   Contact information:   [address] [place] [address] [phone]       Signed: Andrez GrimeJaclyn Gailen Venne, PA-C Orthopaedic Surgery 12/12/2014, 6:25 PM

## 2014-12-13 DIAGNOSIS — R262 Difficulty in walking, not elsewhere classified: Secondary | ICD-10-CM | POA: Diagnosis not present

## 2014-12-13 DIAGNOSIS — I10 Essential (primary) hypertension: Secondary | ICD-10-CM | POA: Diagnosis not present

## 2014-12-13 DIAGNOSIS — M5441 Lumbago with sciatica, right side: Secondary | ICD-10-CM | POA: Diagnosis not present

## 2014-12-13 DIAGNOSIS — E119 Type 2 diabetes mellitus without complications: Secondary | ICD-10-CM | POA: Diagnosis not present

## 2014-12-13 DIAGNOSIS — Z794 Long term (current) use of insulin: Secondary | ICD-10-CM | POA: Diagnosis not present

## 2014-12-15 DIAGNOSIS — Z794 Long term (current) use of insulin: Secondary | ICD-10-CM | POA: Diagnosis not present

## 2014-12-15 DIAGNOSIS — E119 Type 2 diabetes mellitus without complications: Secondary | ICD-10-CM | POA: Diagnosis not present

## 2014-12-15 DIAGNOSIS — R262 Difficulty in walking, not elsewhere classified: Secondary | ICD-10-CM | POA: Diagnosis not present

## 2014-12-15 DIAGNOSIS — I10 Essential (primary) hypertension: Secondary | ICD-10-CM | POA: Diagnosis not present

## 2014-12-15 DIAGNOSIS — M5441 Lumbago with sciatica, right side: Secondary | ICD-10-CM | POA: Diagnosis not present

## 2014-12-19 DIAGNOSIS — Z794 Long term (current) use of insulin: Secondary | ICD-10-CM | POA: Diagnosis not present

## 2014-12-19 DIAGNOSIS — E119 Type 2 diabetes mellitus without complications: Secondary | ICD-10-CM | POA: Diagnosis not present

## 2014-12-19 DIAGNOSIS — R262 Difficulty in walking, not elsewhere classified: Secondary | ICD-10-CM | POA: Diagnosis not present

## 2014-12-19 DIAGNOSIS — M5441 Lumbago with sciatica, right side: Secondary | ICD-10-CM | POA: Diagnosis not present

## 2014-12-19 DIAGNOSIS — I10 Essential (primary) hypertension: Secondary | ICD-10-CM | POA: Diagnosis not present

## 2014-12-21 DIAGNOSIS — R262 Difficulty in walking, not elsewhere classified: Secondary | ICD-10-CM | POA: Diagnosis not present

## 2014-12-21 DIAGNOSIS — Z794 Long term (current) use of insulin: Secondary | ICD-10-CM | POA: Diagnosis not present

## 2014-12-21 DIAGNOSIS — I10 Essential (primary) hypertension: Secondary | ICD-10-CM | POA: Diagnosis not present

## 2014-12-21 DIAGNOSIS — M5441 Lumbago with sciatica, right side: Secondary | ICD-10-CM | POA: Diagnosis not present

## 2014-12-21 DIAGNOSIS — E119 Type 2 diabetes mellitus without complications: Secondary | ICD-10-CM | POA: Diagnosis not present

## 2015-01-11 DIAGNOSIS — E1129 Type 2 diabetes mellitus with other diabetic kidney complication: Secondary | ICD-10-CM | POA: Diagnosis not present

## 2015-01-11 DIAGNOSIS — N189 Chronic kidney disease, unspecified: Secondary | ICD-10-CM | POA: Diagnosis not present

## 2015-01-11 DIAGNOSIS — E785 Hyperlipidemia, unspecified: Secondary | ICD-10-CM | POA: Diagnosis not present

## 2015-01-14 DIAGNOSIS — N189 Chronic kidney disease, unspecified: Secondary | ICD-10-CM | POA: Diagnosis not present

## 2015-01-14 DIAGNOSIS — E785 Hyperlipidemia, unspecified: Secondary | ICD-10-CM | POA: Diagnosis not present

## 2015-01-14 DIAGNOSIS — E1129 Type 2 diabetes mellitus with other diabetic kidney complication: Secondary | ICD-10-CM | POA: Diagnosis not present

## 2015-01-14 DIAGNOSIS — I130 Hypertensive heart and chronic kidney disease with heart failure and stage 1 through stage 4 chronic kidney disease, or unspecified chronic kidney disease: Secondary | ICD-10-CM | POA: Diagnosis not present

## 2015-01-14 DIAGNOSIS — Z9181 History of falling: Secondary | ICD-10-CM | POA: Diagnosis not present

## 2015-03-02 DIAGNOSIS — M9971 Connective tissue and disc stenosis of intervertebral foramina of cervical region: Secondary | ICD-10-CM | POA: Diagnosis not present

## 2015-03-02 DIAGNOSIS — M5090 Cervical disc disorder, unspecified, unspecified cervical region: Secondary | ICD-10-CM | POA: Diagnosis not present

## 2015-03-02 DIAGNOSIS — M4722 Other spondylosis with radiculopathy, cervical region: Secondary | ICD-10-CM | POA: Diagnosis not present

## 2015-03-02 DIAGNOSIS — M5031 Other cervical disc degeneration,  high cervical region: Secondary | ICD-10-CM | POA: Diagnosis not present

## 2015-03-02 DIAGNOSIS — M47812 Spondylosis without myelopathy or radiculopathy, cervical region: Secondary | ICD-10-CM | POA: Diagnosis not present

## 2015-03-02 DIAGNOSIS — M5032 Other cervical disc degeneration, mid-cervical region: Secondary | ICD-10-CM | POA: Diagnosis not present

## 2015-03-02 DIAGNOSIS — Z6832 Body mass index (BMI) 32.0-32.9, adult: Secondary | ICD-10-CM | POA: Diagnosis not present

## 2015-03-13 DIAGNOSIS — M2560 Stiffness of unspecified joint, not elsewhere classified: Secondary | ICD-10-CM | POA: Diagnosis not present

## 2015-03-13 DIAGNOSIS — M545 Low back pain: Secondary | ICD-10-CM | POA: Diagnosis not present

## 2015-03-13 DIAGNOSIS — Z6832 Body mass index (BMI) 32.0-32.9, adult: Secondary | ICD-10-CM | POA: Diagnosis not present

## 2015-03-13 DIAGNOSIS — M5090 Cervical disc disorder, unspecified, unspecified cervical region: Secondary | ICD-10-CM | POA: Diagnosis not present

## 2015-03-13 DIAGNOSIS — M5126 Other intervertebral disc displacement, lumbar region: Secondary | ICD-10-CM | POA: Diagnosis not present

## 2015-03-13 DIAGNOSIS — M25619 Stiffness of unspecified shoulder, not elsewhere classified: Secondary | ICD-10-CM | POA: Diagnosis not present

## 2015-03-20 DIAGNOSIS — M5126 Other intervertebral disc displacement, lumbar region: Secondary | ICD-10-CM | POA: Diagnosis not present

## 2015-03-20 DIAGNOSIS — M545 Low back pain: Secondary | ICD-10-CM | POA: Diagnosis not present

## 2015-03-20 DIAGNOSIS — M2560 Stiffness of unspecified joint, not elsewhere classified: Secondary | ICD-10-CM | POA: Diagnosis not present

## 2015-03-20 DIAGNOSIS — M5090 Cervical disc disorder, unspecified, unspecified cervical region: Secondary | ICD-10-CM | POA: Diagnosis not present

## 2015-03-20 DIAGNOSIS — M25619 Stiffness of unspecified shoulder, not elsewhere classified: Secondary | ICD-10-CM | POA: Diagnosis not present

## 2015-03-23 DIAGNOSIS — M545 Low back pain: Secondary | ICD-10-CM | POA: Diagnosis not present

## 2015-03-23 DIAGNOSIS — M2560 Stiffness of unspecified joint, not elsewhere classified: Secondary | ICD-10-CM | POA: Diagnosis not present

## 2015-03-23 DIAGNOSIS — M5090 Cervical disc disorder, unspecified, unspecified cervical region: Secondary | ICD-10-CM | POA: Diagnosis not present

## 2015-03-23 DIAGNOSIS — M5126 Other intervertebral disc displacement, lumbar region: Secondary | ICD-10-CM | POA: Diagnosis not present

## 2015-03-23 DIAGNOSIS — M25619 Stiffness of unspecified shoulder, not elsewhere classified: Secondary | ICD-10-CM | POA: Diagnosis not present

## 2015-03-27 DIAGNOSIS — M5090 Cervical disc disorder, unspecified, unspecified cervical region: Secondary | ICD-10-CM | POA: Diagnosis not present

## 2015-03-27 DIAGNOSIS — M25619 Stiffness of unspecified shoulder, not elsewhere classified: Secondary | ICD-10-CM | POA: Diagnosis not present

## 2015-03-27 DIAGNOSIS — M2560 Stiffness of unspecified joint, not elsewhere classified: Secondary | ICD-10-CM | POA: Diagnosis not present

## 2015-03-27 DIAGNOSIS — J208 Acute bronchitis due to other specified organisms: Secondary | ICD-10-CM | POA: Diagnosis not present

## 2015-03-27 DIAGNOSIS — M5126 Other intervertebral disc displacement, lumbar region: Secondary | ICD-10-CM | POA: Diagnosis not present

## 2015-03-27 DIAGNOSIS — Z6832 Body mass index (BMI) 32.0-32.9, adult: Secondary | ICD-10-CM | POA: Diagnosis not present

## 2015-03-27 DIAGNOSIS — M545 Low back pain: Secondary | ICD-10-CM | POA: Diagnosis not present

## 2015-03-30 DIAGNOSIS — M5090 Cervical disc disorder, unspecified, unspecified cervical region: Secondary | ICD-10-CM | POA: Diagnosis not present

## 2015-03-30 DIAGNOSIS — M5126 Other intervertebral disc displacement, lumbar region: Secondary | ICD-10-CM | POA: Diagnosis not present

## 2015-03-30 DIAGNOSIS — M545 Low back pain: Secondary | ICD-10-CM | POA: Diagnosis not present

## 2015-03-30 DIAGNOSIS — M25619 Stiffness of unspecified shoulder, not elsewhere classified: Secondary | ICD-10-CM | POA: Diagnosis not present

## 2015-03-30 DIAGNOSIS — M2560 Stiffness of unspecified joint, not elsewhere classified: Secondary | ICD-10-CM | POA: Diagnosis not present

## 2015-04-03 DIAGNOSIS — M5090 Cervical disc disorder, unspecified, unspecified cervical region: Secondary | ICD-10-CM | POA: Diagnosis not present

## 2015-04-03 DIAGNOSIS — M545 Low back pain: Secondary | ICD-10-CM | POA: Diagnosis not present

## 2015-04-03 DIAGNOSIS — E119 Type 2 diabetes mellitus without complications: Secondary | ICD-10-CM | POA: Diagnosis not present

## 2015-04-03 DIAGNOSIS — M2560 Stiffness of unspecified joint, not elsewhere classified: Secondary | ICD-10-CM | POA: Diagnosis not present

## 2015-04-03 DIAGNOSIS — M25619 Stiffness of unspecified shoulder, not elsewhere classified: Secondary | ICD-10-CM | POA: Diagnosis not present

## 2015-04-03 DIAGNOSIS — M5126 Other intervertebral disc displacement, lumbar region: Secondary | ICD-10-CM | POA: Diagnosis not present

## 2015-04-07 DIAGNOSIS — M4807 Spinal stenosis, lumbosacral region: Secondary | ICD-10-CM | POA: Diagnosis not present

## 2015-04-07 DIAGNOSIS — Z9889 Other specified postprocedural states: Secondary | ICD-10-CM | POA: Diagnosis not present

## 2015-04-07 DIAGNOSIS — Z4789 Encounter for other orthopedic aftercare: Secondary | ICD-10-CM | POA: Diagnosis not present

## 2015-04-07 DIAGNOSIS — M5136 Other intervertebral disc degeneration, lumbar region: Secondary | ICD-10-CM | POA: Diagnosis not present

## 2015-04-13 DIAGNOSIS — M545 Low back pain: Secondary | ICD-10-CM | POA: Diagnosis not present

## 2015-04-13 DIAGNOSIS — M5090 Cervical disc disorder, unspecified, unspecified cervical region: Secondary | ICD-10-CM | POA: Diagnosis not present

## 2015-04-13 DIAGNOSIS — M5126 Other intervertebral disc displacement, lumbar region: Secondary | ICD-10-CM | POA: Diagnosis not present

## 2015-04-14 DIAGNOSIS — H25812 Combined forms of age-related cataract, left eye: Secondary | ICD-10-CM | POA: Diagnosis not present

## 2015-04-14 DIAGNOSIS — H40013 Open angle with borderline findings, low risk, bilateral: Secondary | ICD-10-CM | POA: Diagnosis not present

## 2015-04-18 DIAGNOSIS — Z6831 Body mass index (BMI) 31.0-31.9, adult: Secondary | ICD-10-CM | POA: Diagnosis not present

## 2015-04-18 DIAGNOSIS — M25561 Pain in right knee: Secondary | ICD-10-CM | POA: Diagnosis not present

## 2015-04-18 DIAGNOSIS — M898X6 Other specified disorders of bone, lower leg: Secondary | ICD-10-CM | POA: Diagnosis not present

## 2015-04-18 DIAGNOSIS — M545 Low back pain: Secondary | ICD-10-CM | POA: Diagnosis not present

## 2015-04-18 DIAGNOSIS — M5126 Other intervertebral disc displacement, lumbar region: Secondary | ICD-10-CM | POA: Diagnosis not present

## 2015-04-18 DIAGNOSIS — M5090 Cervical disc disorder, unspecified, unspecified cervical region: Secondary | ICD-10-CM | POA: Diagnosis not present

## 2015-04-18 DIAGNOSIS — S81801A Unspecified open wound, right lower leg, initial encounter: Secondary | ICD-10-CM | POA: Diagnosis not present

## 2015-04-18 DIAGNOSIS — Z23 Encounter for immunization: Secondary | ICD-10-CM | POA: Diagnosis not present

## 2015-04-19 DIAGNOSIS — M898X6 Other specified disorders of bone, lower leg: Secondary | ICD-10-CM | POA: Diagnosis not present

## 2015-04-19 DIAGNOSIS — S8991XA Unspecified injury of right lower leg, initial encounter: Secondary | ICD-10-CM | POA: Diagnosis not present

## 2015-04-19 DIAGNOSIS — M25561 Pain in right knee: Secondary | ICD-10-CM | POA: Diagnosis not present

## 2015-04-20 DIAGNOSIS — M5090 Cervical disc disorder, unspecified, unspecified cervical region: Secondary | ICD-10-CM | POA: Diagnosis not present

## 2015-04-20 DIAGNOSIS — M545 Low back pain: Secondary | ICD-10-CM | POA: Diagnosis not present

## 2015-04-20 DIAGNOSIS — M5126 Other intervertebral disc displacement, lumbar region: Secondary | ICD-10-CM | POA: Diagnosis not present

## 2015-04-25 DIAGNOSIS — M5090 Cervical disc disorder, unspecified, unspecified cervical region: Secondary | ICD-10-CM | POA: Diagnosis not present

## 2015-04-25 DIAGNOSIS — M545 Low back pain: Secondary | ICD-10-CM | POA: Diagnosis not present

## 2015-04-25 DIAGNOSIS — M5126 Other intervertebral disc displacement, lumbar region: Secondary | ICD-10-CM | POA: Diagnosis not present

## 2015-04-26 DIAGNOSIS — Z6831 Body mass index (BMI) 31.0-31.9, adult: Secondary | ICD-10-CM | POA: Diagnosis not present

## 2015-04-26 DIAGNOSIS — S81801S Unspecified open wound, right lower leg, sequela: Secondary | ICD-10-CM | POA: Diagnosis not present

## 2015-04-27 DIAGNOSIS — M545 Low back pain: Secondary | ICD-10-CM | POA: Diagnosis not present

## 2015-04-27 DIAGNOSIS — M5090 Cervical disc disorder, unspecified, unspecified cervical region: Secondary | ICD-10-CM | POA: Diagnosis not present

## 2015-04-27 DIAGNOSIS — M5126 Other intervertebral disc displacement, lumbar region: Secondary | ICD-10-CM | POA: Diagnosis not present

## 2015-05-02 DIAGNOSIS — M5126 Other intervertebral disc displacement, lumbar region: Secondary | ICD-10-CM | POA: Diagnosis not present

## 2015-05-02 DIAGNOSIS — M545 Low back pain: Secondary | ICD-10-CM | POA: Diagnosis not present

## 2015-05-02 DIAGNOSIS — M5090 Cervical disc disorder, unspecified, unspecified cervical region: Secondary | ICD-10-CM | POA: Diagnosis not present

## 2015-05-04 DIAGNOSIS — M5090 Cervical disc disorder, unspecified, unspecified cervical region: Secondary | ICD-10-CM | POA: Diagnosis not present

## 2015-05-04 DIAGNOSIS — M545 Low back pain: Secondary | ICD-10-CM | POA: Diagnosis not present

## 2015-05-04 DIAGNOSIS — M5126 Other intervertebral disc displacement, lumbar region: Secondary | ICD-10-CM | POA: Diagnosis not present

## 2015-05-17 DIAGNOSIS — E1129 Type 2 diabetes mellitus with other diabetic kidney complication: Secondary | ICD-10-CM | POA: Diagnosis not present

## 2015-05-17 DIAGNOSIS — E785 Hyperlipidemia, unspecified: Secondary | ICD-10-CM | POA: Diagnosis not present

## 2015-05-17 DIAGNOSIS — N189 Chronic kidney disease, unspecified: Secondary | ICD-10-CM | POA: Diagnosis not present

## 2015-05-20 DIAGNOSIS — E1129 Type 2 diabetes mellitus with other diabetic kidney complication: Secondary | ICD-10-CM | POA: Diagnosis not present

## 2015-05-20 DIAGNOSIS — N189 Chronic kidney disease, unspecified: Secondary | ICD-10-CM | POA: Diagnosis not present

## 2015-05-20 DIAGNOSIS — I130 Hypertensive heart and chronic kidney disease with heart failure and stage 1 through stage 4 chronic kidney disease, or unspecified chronic kidney disease: Secondary | ICD-10-CM | POA: Diagnosis not present

## 2015-05-20 DIAGNOSIS — E785 Hyperlipidemia, unspecified: Secondary | ICD-10-CM | POA: Diagnosis not present

## 2015-05-20 DIAGNOSIS — H6123 Impacted cerumen, bilateral: Secondary | ICD-10-CM | POA: Diagnosis not present

## 2015-05-30 DIAGNOSIS — J449 Chronic obstructive pulmonary disease, unspecified: Secondary | ICD-10-CM | POA: Diagnosis not present

## 2015-05-30 DIAGNOSIS — G4730 Sleep apnea, unspecified: Secondary | ICD-10-CM | POA: Diagnosis not present

## 2015-05-30 DIAGNOSIS — I2510 Atherosclerotic heart disease of native coronary artery without angina pectoris: Secondary | ICD-10-CM | POA: Diagnosis not present

## 2015-05-30 DIAGNOSIS — I252 Old myocardial infarction: Secondary | ICD-10-CM | POA: Diagnosis not present

## 2015-05-30 DIAGNOSIS — E785 Hyperlipidemia, unspecified: Secondary | ICD-10-CM | POA: Diagnosis not present

## 2015-05-30 DIAGNOSIS — Z955 Presence of coronary angioplasty implant and graft: Secondary | ICD-10-CM | POA: Diagnosis not present

## 2015-05-30 DIAGNOSIS — K219 Gastro-esophageal reflux disease without esophagitis: Secondary | ICD-10-CM | POA: Diagnosis not present

## 2015-05-30 DIAGNOSIS — Z87891 Personal history of nicotine dependence: Secondary | ICD-10-CM | POA: Diagnosis not present

## 2015-05-30 DIAGNOSIS — Z8673 Personal history of transient ischemic attack (TIA), and cerebral infarction without residual deficits: Secondary | ICD-10-CM | POA: Diagnosis not present

## 2015-05-30 DIAGNOSIS — I10 Essential (primary) hypertension: Secondary | ICD-10-CM | POA: Diagnosis not present

## 2015-05-30 DIAGNOSIS — H25812 Combined forms of age-related cataract, left eye: Secondary | ICD-10-CM | POA: Diagnosis not present

## 2015-05-30 DIAGNOSIS — Z794 Long term (current) use of insulin: Secondary | ICD-10-CM | POA: Diagnosis not present

## 2015-05-30 DIAGNOSIS — Z950 Presence of cardiac pacemaker: Secondary | ICD-10-CM | POA: Diagnosis not present

## 2015-05-30 DIAGNOSIS — E119 Type 2 diabetes mellitus without complications: Secondary | ICD-10-CM | POA: Diagnosis not present

## 2015-05-30 DIAGNOSIS — Z951 Presence of aortocoronary bypass graft: Secondary | ICD-10-CM | POA: Diagnosis not present

## 2015-06-27 DIAGNOSIS — Z794 Long term (current) use of insulin: Secondary | ICD-10-CM | POA: Diagnosis not present

## 2015-06-27 DIAGNOSIS — I10 Essential (primary) hypertension: Secondary | ICD-10-CM | POA: Diagnosis not present

## 2015-06-27 DIAGNOSIS — Z7982 Long term (current) use of aspirin: Secondary | ICD-10-CM | POA: Diagnosis not present

## 2015-06-27 DIAGNOSIS — Z87891 Personal history of nicotine dependence: Secondary | ICD-10-CM | POA: Diagnosis not present

## 2015-06-27 DIAGNOSIS — Z951 Presence of aortocoronary bypass graft: Secondary | ICD-10-CM | POA: Diagnosis not present

## 2015-06-27 DIAGNOSIS — Z79899 Other long term (current) drug therapy: Secondary | ICD-10-CM | POA: Diagnosis not present

## 2015-06-27 DIAGNOSIS — I252 Old myocardial infarction: Secondary | ICD-10-CM | POA: Diagnosis not present

## 2015-06-27 DIAGNOSIS — H25811 Combined forms of age-related cataract, right eye: Secondary | ICD-10-CM | POA: Diagnosis not present

## 2015-06-27 DIAGNOSIS — I2510 Atherosclerotic heart disease of native coronary artery without angina pectoris: Secondary | ICD-10-CM | POA: Diagnosis not present

## 2015-06-27 DIAGNOSIS — E119 Type 2 diabetes mellitus without complications: Secondary | ICD-10-CM | POA: Diagnosis not present

## 2015-06-27 DIAGNOSIS — K219 Gastro-esophageal reflux disease without esophagitis: Secondary | ICD-10-CM | POA: Diagnosis not present

## 2015-07-01 DIAGNOSIS — J208 Acute bronchitis due to other specified organisms: Secondary | ICD-10-CM | POA: Diagnosis not present

## 2015-07-04 IMAGING — CR DG LUMBAR SPINE 2-3V
2 series · 2 of 2 positions shown · non-contrast
Comparison: None.

CLINICAL DATA: Preoperative examination prior to decompression
procedures at L2-L3, L3-L4, and possibly L4-L5.

EXAM:
LUMBAR SPINE - 2-3 VIEW

[w l-spine a.p. *]
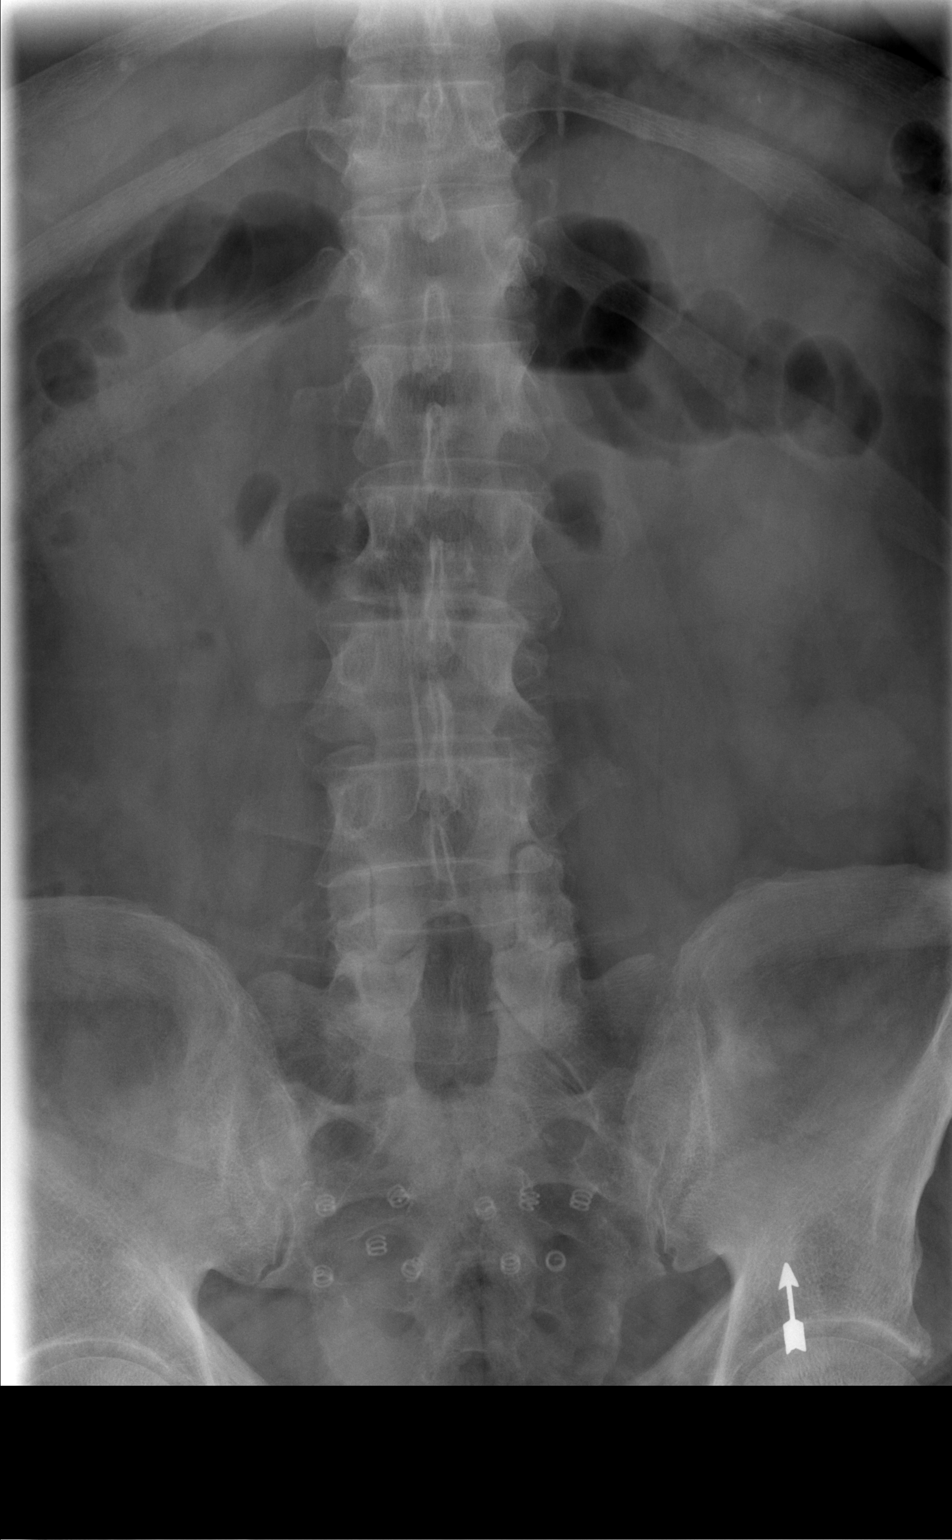

[w l-spine lat *]
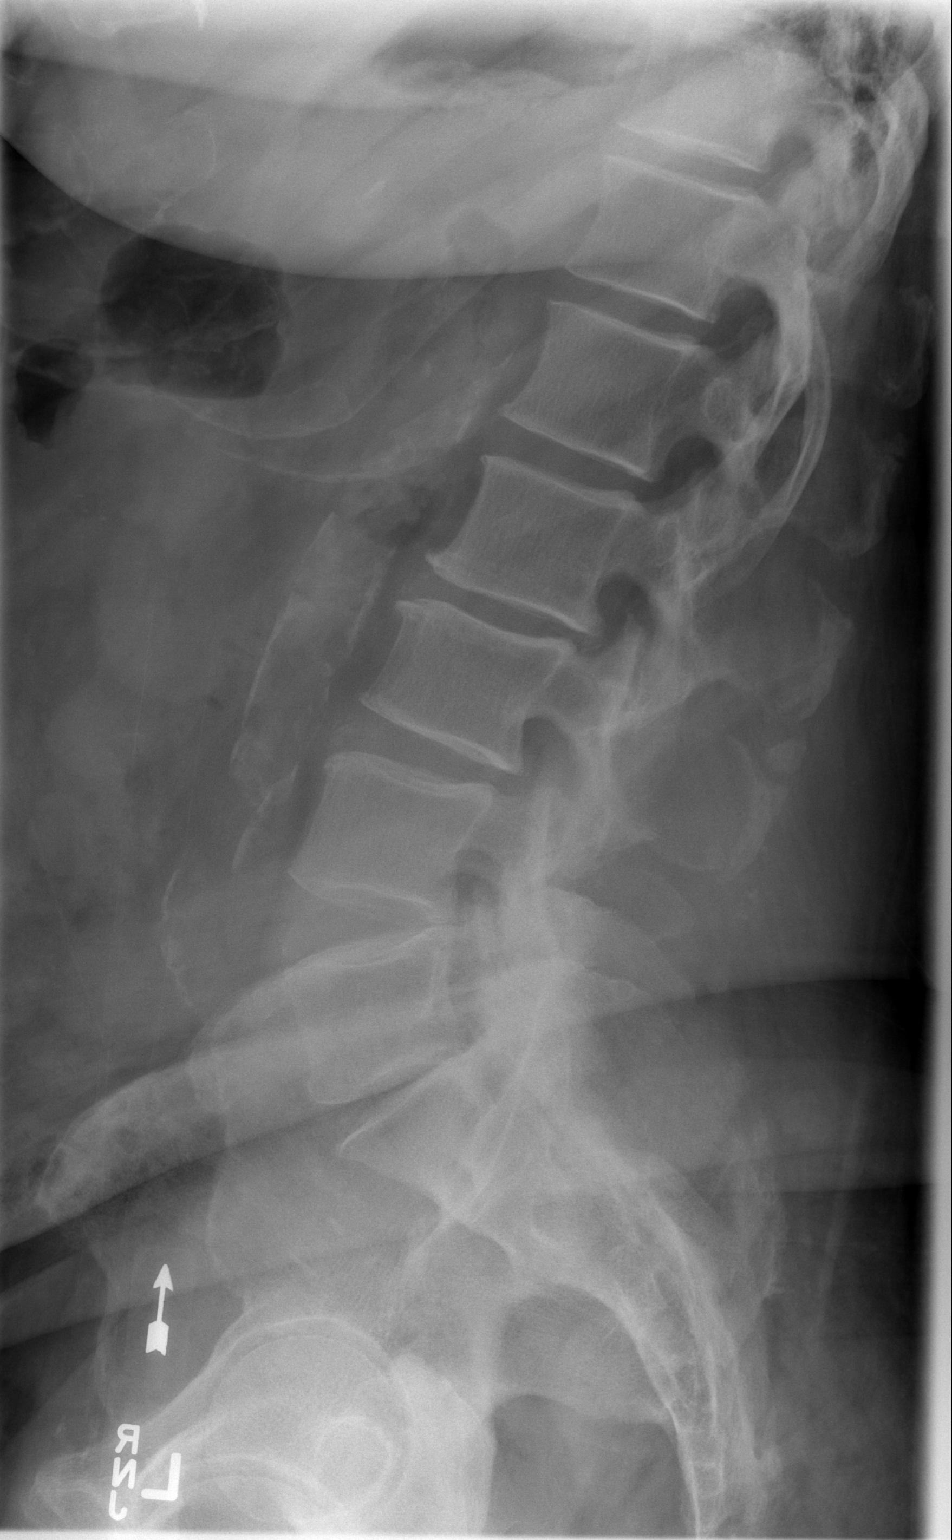

[2 of 2 positions shown; findings below may reference images not displayed]

FINDINGS: There are assumed to be 5 lumbar type non-rib-bearing vertebral
bodies. The vertebral bodies are preserved in height. The pedicles
are intact. There is no spondylolisthesis. There is mild disc space
narrowing at L5-S1 with minimal narrowing and L2-L3 and L3-L4. There
is facet joint hypertrophy at L4-5 and at L5-S1. The observed
portions of the sacrum are normal. The patient has appears to have
undergone previous laminectomy at L5-S1. There is calcification in
the wall of the abdominal aorta.
IMPRESSION: Degenerative disc space narrowing and facet joint hypertrophy as
described. There is no compression fracture or spondylolisthesis.

## 2015-07-06 DIAGNOSIS — H578 Other specified disorders of eye and adnexa: Secondary | ICD-10-CM | POA: Diagnosis not present

## 2015-07-06 DIAGNOSIS — E669 Obesity, unspecified: Secondary | ICD-10-CM | POA: Diagnosis not present

## 2015-07-06 DIAGNOSIS — Z6832 Body mass index (BMI) 32.0-32.9, adult: Secondary | ICD-10-CM | POA: Diagnosis not present

## 2015-07-12 IMAGING — DX DG SPINE 1V PORT
1 series · 1 of 1 positions shown · non-contrast
Comparison: Intraoperative radiograph earlier today.

CLINICAL DATA: Lumbar decompressions surgery.

EXAM:
PORTABLE SPINE - 1 VIEW

[l-spine x-table]
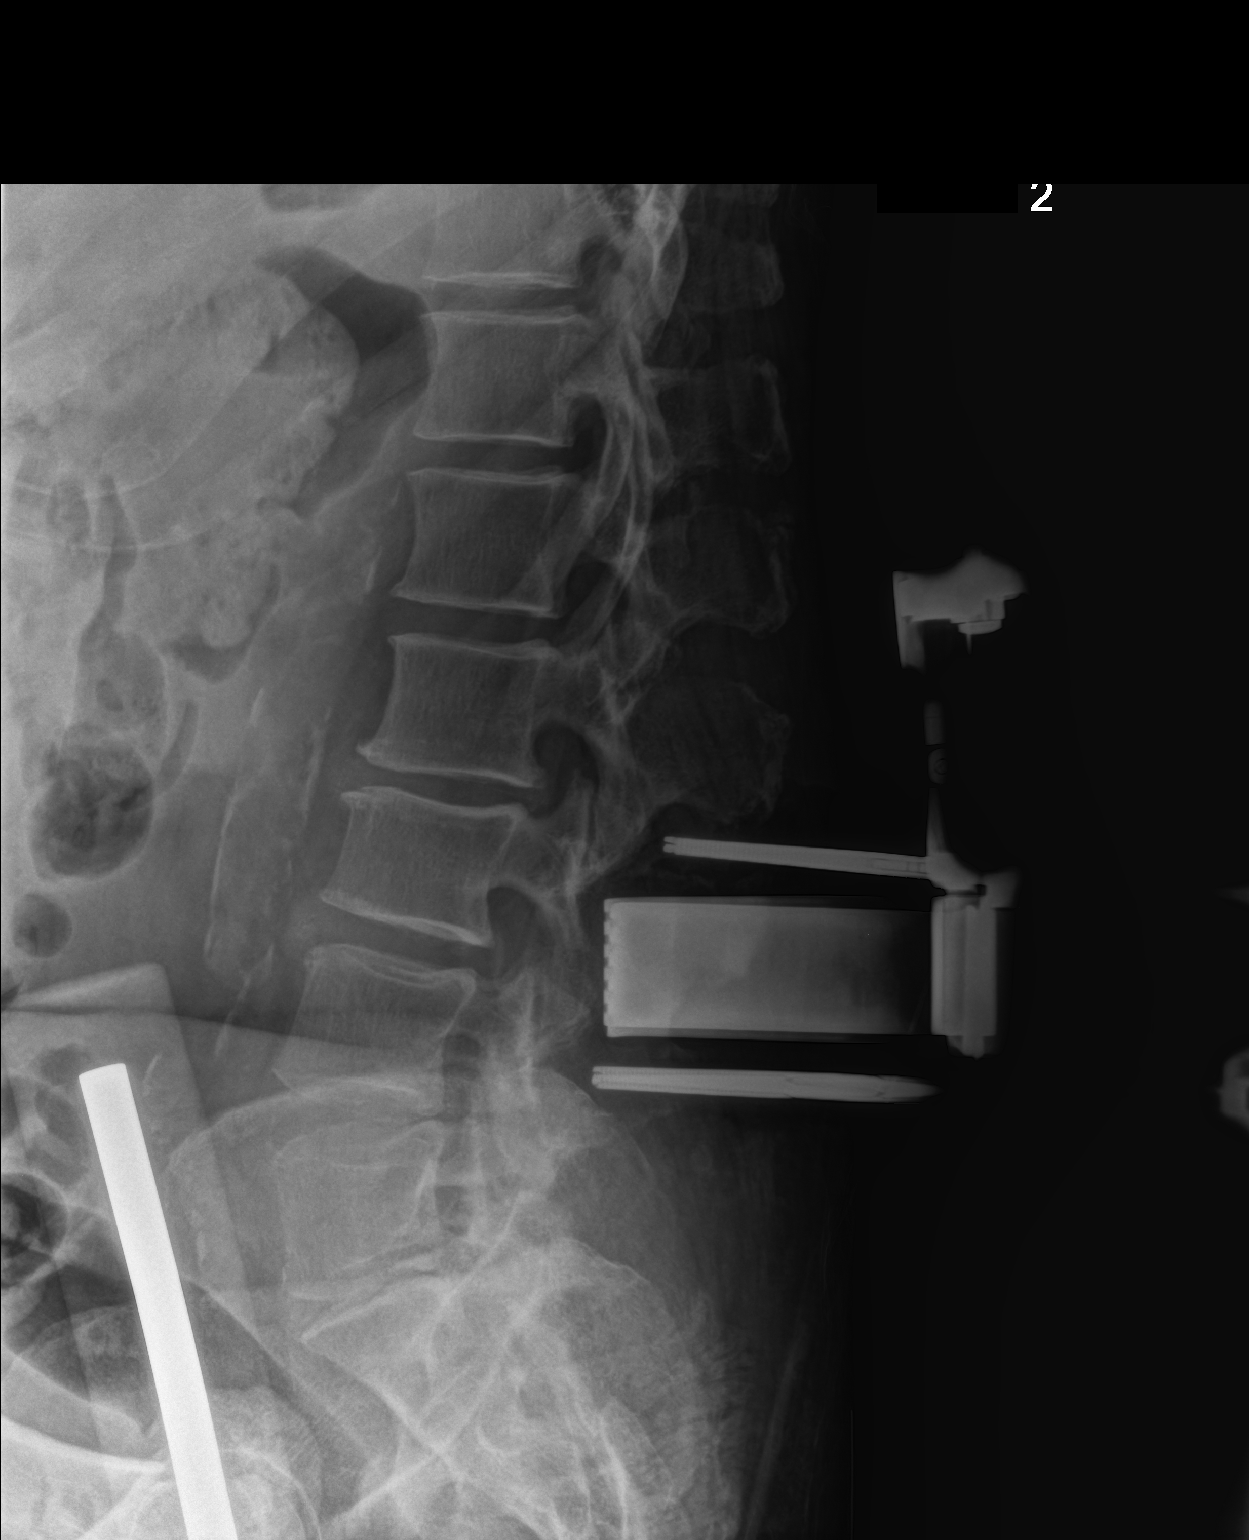

[1 of 1 positions shown; findings below may reference images not displayed]

FINDINGS: Single portable cross table lateral intraoperative radiograph of the
lumbar spine is provided. Tissue retractors are now present and
predominantly project over the L3 spinous process, with additional
thin metallic components on either side of the blades projecting
just inferior to the L2 spinous process and over the L4 spinous
process.
IMPRESSION: Intraoperative localization during lumbar spine surgery.

## 2015-07-18 DIAGNOSIS — Z6831 Body mass index (BMI) 31.0-31.9, adult: Secondary | ICD-10-CM | POA: Diagnosis not present

## 2015-07-18 DIAGNOSIS — M4806 Spinal stenosis, lumbar region: Secondary | ICD-10-CM | POA: Diagnosis not present

## 2015-07-18 DIAGNOSIS — M5136 Other intervertebral disc degeneration, lumbar region: Secondary | ICD-10-CM | POA: Diagnosis not present

## 2015-07-19 DIAGNOSIS — I2510 Atherosclerotic heart disease of native coronary artery without angina pectoris: Secondary | ICD-10-CM | POA: Diagnosis not present

## 2015-07-19 DIAGNOSIS — I10 Essential (primary) hypertension: Secondary | ICD-10-CM | POA: Diagnosis not present

## 2015-07-19 DIAGNOSIS — I480 Paroxysmal atrial fibrillation: Secondary | ICD-10-CM | POA: Diagnosis not present

## 2015-07-20 DIAGNOSIS — I480 Paroxysmal atrial fibrillation: Secondary | ICD-10-CM | POA: Diagnosis not present

## 2015-07-20 DIAGNOSIS — I10 Essential (primary) hypertension: Secondary | ICD-10-CM | POA: Diagnosis not present

## 2015-07-20 DIAGNOSIS — I2510 Atherosclerotic heart disease of native coronary artery without angina pectoris: Secondary | ICD-10-CM | POA: Diagnosis not present

## 2015-07-24 DIAGNOSIS — Z09 Encounter for follow-up examination after completed treatment for conditions other than malignant neoplasm: Secondary | ICD-10-CM | POA: Diagnosis not present

## 2015-07-24 DIAGNOSIS — Z87891 Personal history of nicotine dependence: Secondary | ICD-10-CM | POA: Diagnosis not present

## 2015-07-24 DIAGNOSIS — M545 Low back pain: Secondary | ICD-10-CM | POA: Diagnosis not present

## 2015-07-24 DIAGNOSIS — M25552 Pain in left hip: Secondary | ICD-10-CM | POA: Diagnosis not present

## 2015-07-24 DIAGNOSIS — M4806 Spinal stenosis, lumbar region: Secondary | ICD-10-CM | POA: Diagnosis not present

## 2015-07-24 DIAGNOSIS — Z9889 Other specified postprocedural states: Secondary | ICD-10-CM | POA: Diagnosis not present

## 2015-07-24 DIAGNOSIS — M5410 Radiculopathy, site unspecified: Secondary | ICD-10-CM | POA: Diagnosis not present

## 2015-07-26 DIAGNOSIS — K8080 Other cholelithiasis without obstruction: Secondary | ICD-10-CM | POA: Diagnosis not present

## 2015-07-26 DIAGNOSIS — R1013 Epigastric pain: Secondary | ICD-10-CM | POA: Diagnosis not present

## 2015-07-26 DIAGNOSIS — R072 Precordial pain: Secondary | ICD-10-CM | POA: Diagnosis not present

## 2015-07-26 DIAGNOSIS — K8020 Calculus of gallbladder without cholecystitis without obstruction: Secondary | ICD-10-CM | POA: Diagnosis not present

## 2015-07-26 DIAGNOSIS — R0602 Shortness of breath: Secondary | ICD-10-CM | POA: Diagnosis not present

## 2015-07-27 DIAGNOSIS — Z950 Presence of cardiac pacemaker: Secondary | ICD-10-CM | POA: Insufficient documentation

## 2015-07-27 DIAGNOSIS — I442 Atrioventricular block, complete: Secondary | ICD-10-CM

## 2015-07-27 DIAGNOSIS — R001 Bradycardia, unspecified: Secondary | ICD-10-CM | POA: Diagnosis not present

## 2015-07-27 DIAGNOSIS — Z45018 Encounter for adjustment and management of other part of cardiac pacemaker: Secondary | ICD-10-CM

## 2015-07-27 HISTORY — DX: Encounter for adjustment and management of other part of cardiac pacemaker: Z45.018

## 2015-07-27 HISTORY — DX: Atrioventricular block, complete: I44.2

## 2015-08-02 DIAGNOSIS — K8020 Calculus of gallbladder without cholecystitis without obstruction: Secondary | ICD-10-CM | POA: Diagnosis not present

## 2015-08-02 DIAGNOSIS — I5032 Chronic diastolic (congestive) heart failure: Secondary | ICD-10-CM | POA: Diagnosis not present

## 2015-08-02 DIAGNOSIS — Z6831 Body mass index (BMI) 31.0-31.9, adult: Secondary | ICD-10-CM | POA: Diagnosis not present

## 2015-08-03 DIAGNOSIS — R110 Nausea: Secondary | ICD-10-CM | POA: Diagnosis not present

## 2015-08-03 DIAGNOSIS — R1013 Epigastric pain: Secondary | ICD-10-CM | POA: Diagnosis not present

## 2015-08-03 DIAGNOSIS — K8020 Calculus of gallbladder without cholecystitis without obstruction: Secondary | ICD-10-CM | POA: Diagnosis not present

## 2015-08-03 DIAGNOSIS — R1011 Right upper quadrant pain: Secondary | ICD-10-CM | POA: Diagnosis not present

## 2015-08-08 DIAGNOSIS — M5021 Other cervical disc displacement,  high cervical region: Secondary | ICD-10-CM | POA: Diagnosis not present

## 2015-08-08 DIAGNOSIS — M4807 Spinal stenosis, lumbosacral region: Secondary | ICD-10-CM | POA: Diagnosis not present

## 2015-08-08 DIAGNOSIS — M5117 Intervertebral disc disorders with radiculopathy, lumbosacral region: Secondary | ICD-10-CM | POA: Diagnosis not present

## 2015-08-09 DIAGNOSIS — Z9889 Other specified postprocedural states: Secondary | ICD-10-CM | POA: Diagnosis not present

## 2015-08-09 DIAGNOSIS — M5440 Lumbago with sciatica, unspecified side: Secondary | ICD-10-CM | POA: Diagnosis not present

## 2015-08-09 DIAGNOSIS — Z794 Long term (current) use of insulin: Secondary | ICD-10-CM | POA: Diagnosis not present

## 2015-08-09 DIAGNOSIS — Z87891 Personal history of nicotine dependence: Secondary | ICD-10-CM | POA: Diagnosis not present

## 2015-08-09 DIAGNOSIS — M5116 Intervertebral disc disorders with radiculopathy, lumbar region: Secondary | ICD-10-CM | POA: Diagnosis not present

## 2015-08-09 DIAGNOSIS — I10 Essential (primary) hypertension: Secondary | ICD-10-CM | POA: Diagnosis not present

## 2015-08-09 DIAGNOSIS — Z7982 Long term (current) use of aspirin: Secondary | ICD-10-CM | POA: Diagnosis not present

## 2015-08-09 DIAGNOSIS — E119 Type 2 diabetes mellitus without complications: Secondary | ICD-10-CM | POA: Diagnosis not present

## 2015-08-11 ENCOUNTER — Other Ambulatory Visit: Payer: Self-pay | Admitting: Pathology

## 2015-08-11 DIAGNOSIS — Z7982 Long term (current) use of aspirin: Secondary | ICD-10-CM | POA: Diagnosis not present

## 2015-08-11 DIAGNOSIS — Z79899 Other long term (current) drug therapy: Secondary | ICD-10-CM | POA: Diagnosis not present

## 2015-08-11 DIAGNOSIS — I2510 Atherosclerotic heart disease of native coronary artery without angina pectoris: Secondary | ICD-10-CM | POA: Diagnosis not present

## 2015-08-11 DIAGNOSIS — Z01810 Encounter for preprocedural cardiovascular examination: Secondary | ICD-10-CM | POA: Diagnosis not present

## 2015-08-11 DIAGNOSIS — E1122 Type 2 diabetes mellitus with diabetic chronic kidney disease: Secondary | ICD-10-CM | POA: Diagnosis not present

## 2015-08-11 DIAGNOSIS — N189 Chronic kidney disease, unspecified: Secondary | ICD-10-CM | POA: Diagnosis not present

## 2015-08-11 DIAGNOSIS — I10 Essential (primary) hypertension: Secondary | ICD-10-CM | POA: Diagnosis not present

## 2015-08-11 DIAGNOSIS — I129 Hypertensive chronic kidney disease with stage 1 through stage 4 chronic kidney disease, or unspecified chronic kidney disease: Secondary | ICD-10-CM | POA: Diagnosis not present

## 2015-08-11 DIAGNOSIS — K8020 Calculus of gallbladder without cholecystitis without obstruction: Secondary | ICD-10-CM | POA: Diagnosis not present

## 2015-08-11 DIAGNOSIS — Z951 Presence of aortocoronary bypass graft: Secondary | ICD-10-CM | POA: Diagnosis not present

## 2015-08-11 DIAGNOSIS — E119 Type 2 diabetes mellitus without complications: Secondary | ICD-10-CM | POA: Diagnosis not present

## 2015-08-11 DIAGNOSIS — G4733 Obstructive sleep apnea (adult) (pediatric): Secondary | ICD-10-CM | POA: Diagnosis not present

## 2015-08-11 DIAGNOSIS — E785 Hyperlipidemia, unspecified: Secondary | ICD-10-CM | POA: Diagnosis not present

## 2015-08-11 DIAGNOSIS — Z950 Presence of cardiac pacemaker: Secondary | ICD-10-CM | POA: Diagnosis not present

## 2015-08-11 DIAGNOSIS — K8010 Calculus of gallbladder with chronic cholecystitis without obstruction: Secondary | ICD-10-CM | POA: Diagnosis not present

## 2015-08-11 DIAGNOSIS — K811 Chronic cholecystitis: Secondary | ICD-10-CM | POA: Diagnosis not present

## 2015-08-11 DIAGNOSIS — N183 Chronic kidney disease, stage 3 (moderate): Secondary | ICD-10-CM | POA: Diagnosis not present

## 2015-08-12 DIAGNOSIS — E1122 Type 2 diabetes mellitus with diabetic chronic kidney disease: Secondary | ICD-10-CM | POA: Diagnosis not present

## 2015-08-12 DIAGNOSIS — I129 Hypertensive chronic kidney disease with stage 1 through stage 4 chronic kidney disease, or unspecified chronic kidney disease: Secondary | ICD-10-CM | POA: Diagnosis not present

## 2015-08-12 DIAGNOSIS — K8010 Calculus of gallbladder with chronic cholecystitis without obstruction: Secondary | ICD-10-CM | POA: Diagnosis not present

## 2015-08-12 DIAGNOSIS — N183 Chronic kidney disease, stage 3 (moderate): Secondary | ICD-10-CM | POA: Diagnosis not present

## 2015-08-12 DIAGNOSIS — Z951 Presence of aortocoronary bypass graft: Secondary | ICD-10-CM | POA: Diagnosis not present

## 2015-08-12 DIAGNOSIS — E785 Hyperlipidemia, unspecified: Secondary | ICD-10-CM | POA: Diagnosis not present

## 2015-08-12 DIAGNOSIS — I2510 Atherosclerotic heart disease of native coronary artery without angina pectoris: Secondary | ICD-10-CM | POA: Diagnosis not present

## 2015-08-12 DIAGNOSIS — Z7982 Long term (current) use of aspirin: Secondary | ICD-10-CM | POA: Diagnosis not present

## 2015-08-12 DIAGNOSIS — Z79899 Other long term (current) drug therapy: Secondary | ICD-10-CM | POA: Diagnosis not present

## 2015-08-29 DIAGNOSIS — N189 Chronic kidney disease, unspecified: Secondary | ICD-10-CM | POA: Diagnosis not present

## 2015-08-29 DIAGNOSIS — E1129 Type 2 diabetes mellitus with other diabetic kidney complication: Secondary | ICD-10-CM | POA: Diagnosis not present

## 2015-08-29 DIAGNOSIS — E785 Hyperlipidemia, unspecified: Secondary | ICD-10-CM | POA: Diagnosis not present

## 2015-09-02 DIAGNOSIS — E1129 Type 2 diabetes mellitus with other diabetic kidney complication: Secondary | ICD-10-CM | POA: Diagnosis not present

## 2015-09-02 DIAGNOSIS — N189 Chronic kidney disease, unspecified: Secondary | ICD-10-CM | POA: Diagnosis not present

## 2015-09-02 DIAGNOSIS — H6123 Impacted cerumen, bilateral: Secondary | ICD-10-CM | POA: Diagnosis not present

## 2015-09-02 DIAGNOSIS — E785 Hyperlipidemia, unspecified: Secondary | ICD-10-CM | POA: Diagnosis not present

## 2015-09-02 DIAGNOSIS — I130 Hypertensive heart and chronic kidney disease with heart failure and stage 1 through stage 4 chronic kidney disease, or unspecified chronic kidney disease: Secondary | ICD-10-CM | POA: Diagnosis not present

## 2015-09-04 DIAGNOSIS — E119 Type 2 diabetes mellitus without complications: Secondary | ICD-10-CM | POA: Diagnosis not present

## 2015-09-04 DIAGNOSIS — I110 Hypertensive heart disease with heart failure: Secondary | ICD-10-CM | POA: Insufficient documentation

## 2015-09-04 DIAGNOSIS — I25119 Atherosclerotic heart disease of native coronary artery with unspecified angina pectoris: Secondary | ICD-10-CM | POA: Insufficient documentation

## 2015-09-04 DIAGNOSIS — M5441 Lumbago with sciatica, right side: Secondary | ICD-10-CM | POA: Diagnosis not present

## 2015-09-04 DIAGNOSIS — I6529 Occlusion and stenosis of unspecified carotid artery: Secondary | ICD-10-CM

## 2015-09-04 DIAGNOSIS — R079 Chest pain, unspecified: Secondary | ICD-10-CM

## 2015-09-04 DIAGNOSIS — I2510 Atherosclerotic heart disease of native coronary artery without angina pectoris: Secondary | ICD-10-CM

## 2015-09-04 DIAGNOSIS — I459 Conduction disorder, unspecified: Secondary | ICD-10-CM

## 2015-09-04 DIAGNOSIS — I059 Rheumatic mitral valve disease, unspecified: Secondary | ICD-10-CM | POA: Insufficient documentation

## 2015-09-04 DIAGNOSIS — Z951 Presence of aortocoronary bypass graft: Secondary | ICD-10-CM | POA: Diagnosis not present

## 2015-09-04 DIAGNOSIS — Z87891 Personal history of nicotine dependence: Secondary | ICD-10-CM | POA: Diagnosis not present

## 2015-09-04 DIAGNOSIS — I252 Old myocardial infarction: Secondary | ICD-10-CM | POA: Diagnosis not present

## 2015-09-04 DIAGNOSIS — Z955 Presence of coronary angioplasty implant and graft: Secondary | ICD-10-CM | POA: Diagnosis not present

## 2015-09-04 DIAGNOSIS — M4806 Spinal stenosis, lumbar region: Secondary | ICD-10-CM | POA: Diagnosis not present

## 2015-09-04 DIAGNOSIS — Z9889 Other specified postprocedural states: Secondary | ICD-10-CM | POA: Diagnosis not present

## 2015-09-04 DIAGNOSIS — Z794 Long term (current) use of insulin: Secondary | ICD-10-CM | POA: Diagnosis not present

## 2015-09-04 DIAGNOSIS — R001 Bradycardia, unspecified: Secondary | ICD-10-CM

## 2015-09-04 DIAGNOSIS — I10 Essential (primary) hypertension: Secondary | ICD-10-CM

## 2015-09-04 DIAGNOSIS — M5126 Other intervertebral disc displacement, lumbar region: Secondary | ICD-10-CM | POA: Diagnosis not present

## 2015-09-04 DIAGNOSIS — Z7982 Long term (current) use of aspirin: Secondary | ICD-10-CM | POA: Diagnosis not present

## 2015-09-04 DIAGNOSIS — M5442 Lumbago with sciatica, left side: Secondary | ICD-10-CM | POA: Diagnosis not present

## 2015-09-04 HISTORY — DX: Occlusion and stenosis of unspecified carotid artery: I65.29

## 2015-09-04 HISTORY — DX: Bradycardia, unspecified: R00.1

## 2015-09-04 HISTORY — DX: Atherosclerotic heart disease of native coronary artery without angina pectoris: I25.10

## 2015-09-04 HISTORY — DX: Essential (primary) hypertension: I10

## 2015-09-04 HISTORY — DX: Chest pain, unspecified: R07.9

## 2015-09-04 HISTORY — DX: Conduction disorder, unspecified: I45.9

## 2015-09-04 HISTORY — DX: Rheumatic mitral valve disease, unspecified: I05.9

## 2015-09-21 DIAGNOSIS — I629 Nontraumatic intracranial hemorrhage, unspecified: Secondary | ICD-10-CM | POA: Diagnosis not present

## 2015-09-21 DIAGNOSIS — M5440 Lumbago with sciatica, unspecified side: Secondary | ICD-10-CM | POA: Diagnosis not present

## 2015-09-21 DIAGNOSIS — I110 Hypertensive heart disease with heart failure: Secondary | ICD-10-CM | POA: Diagnosis not present

## 2015-09-21 DIAGNOSIS — Z950 Presence of cardiac pacemaker: Secondary | ICD-10-CM | POA: Diagnosis not present

## 2015-09-21 DIAGNOSIS — Z951 Presence of aortocoronary bypass graft: Secondary | ICD-10-CM | POA: Diagnosis not present

## 2015-09-21 DIAGNOSIS — I2510 Atherosclerotic heart disease of native coronary artery without angina pectoris: Secondary | ICD-10-CM | POA: Diagnosis not present

## 2015-09-21 DIAGNOSIS — I059 Rheumatic mitral valve disease, unspecified: Secondary | ICD-10-CM | POA: Diagnosis not present

## 2015-09-21 DIAGNOSIS — M5116 Intervertebral disc disorders with radiculopathy, lumbar region: Secondary | ICD-10-CM | POA: Diagnosis not present

## 2015-09-21 DIAGNOSIS — I639 Cerebral infarction, unspecified: Secondary | ICD-10-CM | POA: Diagnosis not present

## 2015-09-21 DIAGNOSIS — Z483 Aftercare following surgery for neoplasm: Secondary | ICD-10-CM | POA: Diagnosis not present

## 2015-09-21 DIAGNOSIS — M5126 Other intervertebral disc displacement, lumbar region: Secondary | ICD-10-CM | POA: Diagnosis not present

## 2015-09-21 DIAGNOSIS — H902 Conductive hearing loss, unspecified: Secondary | ICD-10-CM | POA: Diagnosis not present

## 2015-09-21 DIAGNOSIS — R001 Bradycardia, unspecified: Secondary | ICD-10-CM | POA: Diagnosis not present

## 2015-09-21 DIAGNOSIS — M961 Postlaminectomy syndrome, not elsewhere classified: Secondary | ICD-10-CM | POA: Diagnosis not present

## 2015-09-21 DIAGNOSIS — I252 Old myocardial infarction: Secondary | ICD-10-CM | POA: Diagnosis not present

## 2015-09-21 DIAGNOSIS — Z955 Presence of coronary angioplasty implant and graft: Secondary | ICD-10-CM | POA: Diagnosis not present

## 2015-09-21 DIAGNOSIS — Z7401 Bed confinement status: Secondary | ICD-10-CM | POA: Diagnosis not present

## 2015-09-21 DIAGNOSIS — M4806 Spinal stenosis, lumbar region: Secondary | ICD-10-CM | POA: Diagnosis not present

## 2015-09-21 DIAGNOSIS — I442 Atrioventricular block, complete: Secondary | ICD-10-CM | POA: Diagnosis not present

## 2015-09-21 DIAGNOSIS — I480 Paroxysmal atrial fibrillation: Secondary | ICD-10-CM | POA: Diagnosis not present

## 2015-09-21 DIAGNOSIS — K219 Gastro-esophageal reflux disease without esophagitis: Secondary | ICD-10-CM | POA: Diagnosis not present

## 2015-09-21 DIAGNOSIS — G9619 Other disorders of meninges, not elsewhere classified: Secondary | ICD-10-CM | POA: Diagnosis not present

## 2015-09-21 DIAGNOSIS — E119 Type 2 diabetes mellitus without complications: Secondary | ICD-10-CM | POA: Diagnosis not present

## 2015-09-21 DIAGNOSIS — I5032 Chronic diastolic (congestive) heart failure: Secondary | ICD-10-CM | POA: Diagnosis not present

## 2015-09-21 DIAGNOSIS — I4891 Unspecified atrial fibrillation: Secondary | ICD-10-CM | POA: Diagnosis not present

## 2015-09-21 DIAGNOSIS — I6529 Occlusion and stenosis of unspecified carotid artery: Secondary | ICD-10-CM | POA: Diagnosis not present

## 2015-09-21 DIAGNOSIS — Z9889 Other specified postprocedural states: Secondary | ICD-10-CM | POA: Diagnosis not present

## 2015-09-21 DIAGNOSIS — I10 Essential (primary) hypertension: Secondary | ICD-10-CM | POA: Diagnosis not present

## 2015-09-27 DIAGNOSIS — M5126 Other intervertebral disc displacement, lumbar region: Secondary | ICD-10-CM | POA: Diagnosis not present

## 2015-09-27 DIAGNOSIS — M961 Postlaminectomy syndrome, not elsewhere classified: Secondary | ICD-10-CM | POA: Diagnosis not present

## 2015-09-27 DIAGNOSIS — Z483 Aftercare following surgery for neoplasm: Secondary | ICD-10-CM | POA: Diagnosis not present

## 2015-09-27 DIAGNOSIS — I442 Atrioventricular block, complete: Secondary | ICD-10-CM | POA: Diagnosis not present

## 2015-09-27 DIAGNOSIS — I2510 Atherosclerotic heart disease of native coronary artery without angina pectoris: Secondary | ICD-10-CM | POA: Diagnosis not present

## 2015-09-27 DIAGNOSIS — K219 Gastro-esophageal reflux disease without esophagitis: Secondary | ICD-10-CM | POA: Diagnosis not present

## 2015-09-27 DIAGNOSIS — H902 Conductive hearing loss, unspecified: Secondary | ICD-10-CM | POA: Diagnosis not present

## 2015-09-27 DIAGNOSIS — Z7401 Bed confinement status: Secondary | ICD-10-CM | POA: Diagnosis not present

## 2015-09-27 DIAGNOSIS — G8918 Other acute postprocedural pain: Secondary | ICD-10-CM | POA: Diagnosis not present

## 2015-09-27 DIAGNOSIS — I059 Rheumatic mitral valve disease, unspecified: Secondary | ICD-10-CM | POA: Diagnosis not present

## 2015-09-27 DIAGNOSIS — I119 Hypertensive heart disease without heart failure: Secondary | ICD-10-CM | POA: Diagnosis not present

## 2015-09-27 DIAGNOSIS — I10 Essential (primary) hypertension: Secondary | ICD-10-CM | POA: Diagnosis not present

## 2015-09-27 DIAGNOSIS — I4891 Unspecified atrial fibrillation: Secondary | ICD-10-CM | POA: Diagnosis not present

## 2015-09-27 DIAGNOSIS — R001 Bradycardia, unspecified: Secondary | ICD-10-CM | POA: Diagnosis not present

## 2015-09-27 DIAGNOSIS — I629 Nontraumatic intracranial hemorrhage, unspecified: Secondary | ICD-10-CM | POA: Diagnosis not present

## 2015-09-27 DIAGNOSIS — I5032 Chronic diastolic (congestive) heart failure: Secondary | ICD-10-CM | POA: Diagnosis not present

## 2015-09-27 DIAGNOSIS — I252 Old myocardial infarction: Secondary | ICD-10-CM | POA: Diagnosis not present

## 2015-09-27 DIAGNOSIS — I639 Cerebral infarction, unspecified: Secondary | ICD-10-CM | POA: Diagnosis not present

## 2015-09-27 DIAGNOSIS — M4806 Spinal stenosis, lumbar region: Secondary | ICD-10-CM | POA: Diagnosis not present

## 2015-09-27 DIAGNOSIS — M5440 Lumbago with sciatica, unspecified side: Secondary | ICD-10-CM | POA: Diagnosis not present

## 2015-09-29 DIAGNOSIS — M5126 Other intervertebral disc displacement, lumbar region: Secondary | ICD-10-CM | POA: Diagnosis not present

## 2015-09-29 DIAGNOSIS — I119 Hypertensive heart disease without heart failure: Secondary | ICD-10-CM | POA: Diagnosis not present

## 2015-09-29 DIAGNOSIS — G8918 Other acute postprocedural pain: Secondary | ICD-10-CM | POA: Diagnosis not present

## 2015-09-29 DIAGNOSIS — M4806 Spinal stenosis, lumbar region: Secondary | ICD-10-CM | POA: Diagnosis not present

## 2015-10-03 DIAGNOSIS — I252 Old myocardial infarction: Secondary | ICD-10-CM | POA: Diagnosis not present

## 2015-10-03 DIAGNOSIS — M6281 Muscle weakness (generalized): Secondary | ICD-10-CM | POA: Diagnosis not present

## 2015-10-03 DIAGNOSIS — I4891 Unspecified atrial fibrillation: Secondary | ICD-10-CM | POA: Diagnosis not present

## 2015-10-03 DIAGNOSIS — R262 Difficulty in walking, not elsewhere classified: Secondary | ICD-10-CM | POA: Diagnosis not present

## 2015-10-03 DIAGNOSIS — I10 Essential (primary) hypertension: Secondary | ICD-10-CM | POA: Diagnosis not present

## 2015-10-03 DIAGNOSIS — I5032 Chronic diastolic (congestive) heart failure: Secondary | ICD-10-CM | POA: Diagnosis not present

## 2015-10-03 DIAGNOSIS — M961 Postlaminectomy syndrome, not elsewhere classified: Secondary | ICD-10-CM | POA: Diagnosis not present

## 2015-10-03 DIAGNOSIS — Z4789 Encounter for other orthopedic aftercare: Secondary | ICD-10-CM | POA: Diagnosis not present

## 2015-10-03 DIAGNOSIS — M5126 Other intervertebral disc displacement, lumbar region: Secondary | ICD-10-CM | POA: Diagnosis not present

## 2015-10-03 DIAGNOSIS — I059 Rheumatic mitral valve disease, unspecified: Secondary | ICD-10-CM | POA: Diagnosis not present

## 2015-10-03 DIAGNOSIS — H902 Conductive hearing loss, unspecified: Secondary | ICD-10-CM | POA: Diagnosis not present

## 2015-10-03 DIAGNOSIS — E119 Type 2 diabetes mellitus without complications: Secondary | ICD-10-CM | POA: Diagnosis not present

## 2015-10-03 DIAGNOSIS — I2510 Atherosclerotic heart disease of native coronary artery without angina pectoris: Secondary | ICD-10-CM | POA: Diagnosis not present

## 2015-10-03 DIAGNOSIS — R001 Bradycardia, unspecified: Secondary | ICD-10-CM | POA: Diagnosis not present

## 2015-10-03 DIAGNOSIS — M5440 Lumbago with sciatica, unspecified side: Secondary | ICD-10-CM | POA: Diagnosis not present

## 2015-10-03 DIAGNOSIS — M4806 Spinal stenosis, lumbar region: Secondary | ICD-10-CM | POA: Diagnosis not present

## 2015-10-03 DIAGNOSIS — K219 Gastro-esophageal reflux disease without esophagitis: Secondary | ICD-10-CM | POA: Diagnosis not present

## 2015-10-03 DIAGNOSIS — I629 Nontraumatic intracranial hemorrhage, unspecified: Secondary | ICD-10-CM | POA: Diagnosis not present

## 2015-10-03 DIAGNOSIS — I442 Atrioventricular block, complete: Secondary | ICD-10-CM | POA: Diagnosis not present

## 2015-10-04 DIAGNOSIS — J208 Acute bronchitis due to other specified organisms: Secondary | ICD-10-CM | POA: Diagnosis not present

## 2015-10-05 DIAGNOSIS — R001 Bradycardia, unspecified: Secondary | ICD-10-CM | POA: Diagnosis not present

## 2015-10-05 DIAGNOSIS — I629 Nontraumatic intracranial hemorrhage, unspecified: Secondary | ICD-10-CM | POA: Diagnosis not present

## 2015-10-05 DIAGNOSIS — M5440 Lumbago with sciatica, unspecified side: Secondary | ICD-10-CM | POA: Diagnosis not present

## 2015-10-05 DIAGNOSIS — M5126 Other intervertebral disc displacement, lumbar region: Secondary | ICD-10-CM | POA: Diagnosis not present

## 2015-10-05 DIAGNOSIS — M4806 Spinal stenosis, lumbar region: Secondary | ICD-10-CM | POA: Diagnosis not present

## 2015-10-05 DIAGNOSIS — R262 Difficulty in walking, not elsewhere classified: Secondary | ICD-10-CM | POA: Diagnosis not present

## 2015-10-05 DIAGNOSIS — E119 Type 2 diabetes mellitus without complications: Secondary | ICD-10-CM | POA: Diagnosis not present

## 2015-10-05 DIAGNOSIS — I2510 Atherosclerotic heart disease of native coronary artery without angina pectoris: Secondary | ICD-10-CM | POA: Diagnosis not present

## 2015-10-05 DIAGNOSIS — M6281 Muscle weakness (generalized): Secondary | ICD-10-CM | POA: Diagnosis not present

## 2015-10-05 DIAGNOSIS — Z4789 Encounter for other orthopedic aftercare: Secondary | ICD-10-CM | POA: Diagnosis not present

## 2015-10-05 DIAGNOSIS — H902 Conductive hearing loss, unspecified: Secondary | ICD-10-CM | POA: Diagnosis not present

## 2015-10-05 DIAGNOSIS — I4891 Unspecified atrial fibrillation: Secondary | ICD-10-CM | POA: Diagnosis not present

## 2015-10-05 DIAGNOSIS — I5032 Chronic diastolic (congestive) heart failure: Secondary | ICD-10-CM | POA: Diagnosis not present

## 2015-10-05 DIAGNOSIS — I252 Old myocardial infarction: Secondary | ICD-10-CM | POA: Diagnosis not present

## 2015-10-05 DIAGNOSIS — I10 Essential (primary) hypertension: Secondary | ICD-10-CM | POA: Diagnosis not present

## 2015-10-05 DIAGNOSIS — K219 Gastro-esophageal reflux disease without esophagitis: Secondary | ICD-10-CM | POA: Diagnosis not present

## 2015-10-05 DIAGNOSIS — M961 Postlaminectomy syndrome, not elsewhere classified: Secondary | ICD-10-CM | POA: Diagnosis not present

## 2015-10-05 DIAGNOSIS — I442 Atrioventricular block, complete: Secondary | ICD-10-CM | POA: Diagnosis not present

## 2015-10-05 DIAGNOSIS — I059 Rheumatic mitral valve disease, unspecified: Secondary | ICD-10-CM | POA: Diagnosis not present

## 2015-10-06 DIAGNOSIS — H902 Conductive hearing loss, unspecified: Secondary | ICD-10-CM | POA: Diagnosis not present

## 2015-10-06 DIAGNOSIS — I629 Nontraumatic intracranial hemorrhage, unspecified: Secondary | ICD-10-CM | POA: Diagnosis not present

## 2015-10-06 DIAGNOSIS — I442 Atrioventricular block, complete: Secondary | ICD-10-CM | POA: Diagnosis not present

## 2015-10-06 DIAGNOSIS — Z4789 Encounter for other orthopedic aftercare: Secondary | ICD-10-CM | POA: Diagnosis not present

## 2015-10-06 DIAGNOSIS — M4806 Spinal stenosis, lumbar region: Secondary | ICD-10-CM | POA: Diagnosis not present

## 2015-10-06 DIAGNOSIS — I4891 Unspecified atrial fibrillation: Secondary | ICD-10-CM | POA: Diagnosis not present

## 2015-10-06 DIAGNOSIS — I252 Old myocardial infarction: Secondary | ICD-10-CM | POA: Diagnosis not present

## 2015-10-06 DIAGNOSIS — M5126 Other intervertebral disc displacement, lumbar region: Secondary | ICD-10-CM | POA: Diagnosis not present

## 2015-10-06 DIAGNOSIS — E119 Type 2 diabetes mellitus without complications: Secondary | ICD-10-CM | POA: Diagnosis not present

## 2015-10-06 DIAGNOSIS — I2510 Atherosclerotic heart disease of native coronary artery without angina pectoris: Secondary | ICD-10-CM | POA: Diagnosis not present

## 2015-10-06 DIAGNOSIS — R262 Difficulty in walking, not elsewhere classified: Secondary | ICD-10-CM | POA: Diagnosis not present

## 2015-10-06 DIAGNOSIS — M6281 Muscle weakness (generalized): Secondary | ICD-10-CM | POA: Diagnosis not present

## 2015-10-06 DIAGNOSIS — M5440 Lumbago with sciatica, unspecified side: Secondary | ICD-10-CM | POA: Diagnosis not present

## 2015-10-06 DIAGNOSIS — R001 Bradycardia, unspecified: Secondary | ICD-10-CM | POA: Diagnosis not present

## 2015-10-06 DIAGNOSIS — I10 Essential (primary) hypertension: Secondary | ICD-10-CM | POA: Diagnosis not present

## 2015-10-06 DIAGNOSIS — I059 Rheumatic mitral valve disease, unspecified: Secondary | ICD-10-CM | POA: Diagnosis not present

## 2015-10-06 DIAGNOSIS — I5032 Chronic diastolic (congestive) heart failure: Secondary | ICD-10-CM | POA: Diagnosis not present

## 2015-10-06 DIAGNOSIS — K219 Gastro-esophageal reflux disease without esophagitis: Secondary | ICD-10-CM | POA: Diagnosis not present

## 2015-10-06 DIAGNOSIS — M961 Postlaminectomy syndrome, not elsewhere classified: Secondary | ICD-10-CM | POA: Diagnosis not present

## 2015-10-10 DIAGNOSIS — I10 Essential (primary) hypertension: Secondary | ICD-10-CM | POA: Diagnosis not present

## 2015-10-10 DIAGNOSIS — I629 Nontraumatic intracranial hemorrhage, unspecified: Secondary | ICD-10-CM | POA: Diagnosis not present

## 2015-10-10 DIAGNOSIS — I442 Atrioventricular block, complete: Secondary | ICD-10-CM | POA: Diagnosis not present

## 2015-10-10 DIAGNOSIS — R262 Difficulty in walking, not elsewhere classified: Secondary | ICD-10-CM | POA: Diagnosis not present

## 2015-10-10 DIAGNOSIS — I4891 Unspecified atrial fibrillation: Secondary | ICD-10-CM | POA: Diagnosis not present

## 2015-10-10 DIAGNOSIS — I2510 Atherosclerotic heart disease of native coronary artery without angina pectoris: Secondary | ICD-10-CM | POA: Diagnosis not present

## 2015-10-10 DIAGNOSIS — K219 Gastro-esophageal reflux disease without esophagitis: Secondary | ICD-10-CM | POA: Diagnosis not present

## 2015-10-10 DIAGNOSIS — I059 Rheumatic mitral valve disease, unspecified: Secondary | ICD-10-CM | POA: Diagnosis not present

## 2015-10-10 DIAGNOSIS — I5032 Chronic diastolic (congestive) heart failure: Secondary | ICD-10-CM | POA: Diagnosis not present

## 2015-10-10 DIAGNOSIS — Z4789 Encounter for other orthopedic aftercare: Secondary | ICD-10-CM | POA: Diagnosis not present

## 2015-10-10 DIAGNOSIS — E119 Type 2 diabetes mellitus without complications: Secondary | ICD-10-CM | POA: Diagnosis not present

## 2015-10-10 DIAGNOSIS — I252 Old myocardial infarction: Secondary | ICD-10-CM | POA: Diagnosis not present

## 2015-10-10 DIAGNOSIS — M6281 Muscle weakness (generalized): Secondary | ICD-10-CM | POA: Diagnosis not present

## 2015-10-10 DIAGNOSIS — M5126 Other intervertebral disc displacement, lumbar region: Secondary | ICD-10-CM | POA: Diagnosis not present

## 2015-10-10 DIAGNOSIS — M4806 Spinal stenosis, lumbar region: Secondary | ICD-10-CM | POA: Diagnosis not present

## 2015-10-10 DIAGNOSIS — R001 Bradycardia, unspecified: Secondary | ICD-10-CM | POA: Diagnosis not present

## 2015-10-10 DIAGNOSIS — M5440 Lumbago with sciatica, unspecified side: Secondary | ICD-10-CM | POA: Diagnosis not present

## 2015-10-10 DIAGNOSIS — H902 Conductive hearing loss, unspecified: Secondary | ICD-10-CM | POA: Diagnosis not present

## 2015-10-10 DIAGNOSIS — M961 Postlaminectomy syndrome, not elsewhere classified: Secondary | ICD-10-CM | POA: Diagnosis not present

## 2015-10-12 DIAGNOSIS — I252 Old myocardial infarction: Secondary | ICD-10-CM | POA: Diagnosis not present

## 2015-10-12 DIAGNOSIS — R001 Bradycardia, unspecified: Secondary | ICD-10-CM | POA: Diagnosis not present

## 2015-10-12 DIAGNOSIS — M5440 Lumbago with sciatica, unspecified side: Secondary | ICD-10-CM | POA: Diagnosis not present

## 2015-10-12 DIAGNOSIS — I059 Rheumatic mitral valve disease, unspecified: Secondary | ICD-10-CM | POA: Diagnosis not present

## 2015-10-12 DIAGNOSIS — M6281 Muscle weakness (generalized): Secondary | ICD-10-CM | POA: Diagnosis not present

## 2015-10-12 DIAGNOSIS — M5126 Other intervertebral disc displacement, lumbar region: Secondary | ICD-10-CM | POA: Diagnosis not present

## 2015-10-12 DIAGNOSIS — I442 Atrioventricular block, complete: Secondary | ICD-10-CM | POA: Diagnosis not present

## 2015-10-12 DIAGNOSIS — H902 Conductive hearing loss, unspecified: Secondary | ICD-10-CM | POA: Diagnosis not present

## 2015-10-12 DIAGNOSIS — I4891 Unspecified atrial fibrillation: Secondary | ICD-10-CM | POA: Diagnosis not present

## 2015-10-12 DIAGNOSIS — R262 Difficulty in walking, not elsewhere classified: Secondary | ICD-10-CM | POA: Diagnosis not present

## 2015-10-12 DIAGNOSIS — I629 Nontraumatic intracranial hemorrhage, unspecified: Secondary | ICD-10-CM | POA: Diagnosis not present

## 2015-10-12 DIAGNOSIS — I5032 Chronic diastolic (congestive) heart failure: Secondary | ICD-10-CM | POA: Diagnosis not present

## 2015-10-12 DIAGNOSIS — I10 Essential (primary) hypertension: Secondary | ICD-10-CM | POA: Diagnosis not present

## 2015-10-12 DIAGNOSIS — M4806 Spinal stenosis, lumbar region: Secondary | ICD-10-CM | POA: Diagnosis not present

## 2015-10-12 DIAGNOSIS — K219 Gastro-esophageal reflux disease without esophagitis: Secondary | ICD-10-CM | POA: Diagnosis not present

## 2015-10-12 DIAGNOSIS — E119 Type 2 diabetes mellitus without complications: Secondary | ICD-10-CM | POA: Diagnosis not present

## 2015-10-12 DIAGNOSIS — I2510 Atherosclerotic heart disease of native coronary artery without angina pectoris: Secondary | ICD-10-CM | POA: Diagnosis not present

## 2015-10-12 DIAGNOSIS — Z4789 Encounter for other orthopedic aftercare: Secondary | ICD-10-CM | POA: Diagnosis not present

## 2015-10-12 DIAGNOSIS — M961 Postlaminectomy syndrome, not elsewhere classified: Secondary | ICD-10-CM | POA: Diagnosis not present

## 2015-10-13 DIAGNOSIS — M961 Postlaminectomy syndrome, not elsewhere classified: Secondary | ICD-10-CM | POA: Diagnosis not present

## 2015-10-13 DIAGNOSIS — I059 Rheumatic mitral valve disease, unspecified: Secondary | ICD-10-CM | POA: Diagnosis not present

## 2015-10-13 DIAGNOSIS — M5440 Lumbago with sciatica, unspecified side: Secondary | ICD-10-CM | POA: Diagnosis not present

## 2015-10-13 DIAGNOSIS — I2510 Atherosclerotic heart disease of native coronary artery without angina pectoris: Secondary | ICD-10-CM | POA: Diagnosis not present

## 2015-10-13 DIAGNOSIS — I252 Old myocardial infarction: Secondary | ICD-10-CM | POA: Diagnosis not present

## 2015-10-13 DIAGNOSIS — R262 Difficulty in walking, not elsewhere classified: Secondary | ICD-10-CM | POA: Diagnosis not present

## 2015-10-13 DIAGNOSIS — H902 Conductive hearing loss, unspecified: Secondary | ICD-10-CM | POA: Diagnosis not present

## 2015-10-13 DIAGNOSIS — Z4789 Encounter for other orthopedic aftercare: Secondary | ICD-10-CM | POA: Diagnosis not present

## 2015-10-13 DIAGNOSIS — E119 Type 2 diabetes mellitus without complications: Secondary | ICD-10-CM | POA: Diagnosis not present

## 2015-10-13 DIAGNOSIS — K219 Gastro-esophageal reflux disease without esophagitis: Secondary | ICD-10-CM | POA: Diagnosis not present

## 2015-10-13 DIAGNOSIS — M6281 Muscle weakness (generalized): Secondary | ICD-10-CM | POA: Diagnosis not present

## 2015-10-13 DIAGNOSIS — I629 Nontraumatic intracranial hemorrhage, unspecified: Secondary | ICD-10-CM | POA: Diagnosis not present

## 2015-10-13 DIAGNOSIS — I4891 Unspecified atrial fibrillation: Secondary | ICD-10-CM | POA: Diagnosis not present

## 2015-10-13 DIAGNOSIS — M5126 Other intervertebral disc displacement, lumbar region: Secondary | ICD-10-CM | POA: Diagnosis not present

## 2015-10-13 DIAGNOSIS — R001 Bradycardia, unspecified: Secondary | ICD-10-CM | POA: Diagnosis not present

## 2015-10-13 DIAGNOSIS — I10 Essential (primary) hypertension: Secondary | ICD-10-CM | POA: Diagnosis not present

## 2015-10-13 DIAGNOSIS — I442 Atrioventricular block, complete: Secondary | ICD-10-CM | POA: Diagnosis not present

## 2015-10-13 DIAGNOSIS — M4806 Spinal stenosis, lumbar region: Secondary | ICD-10-CM | POA: Diagnosis not present

## 2015-10-13 DIAGNOSIS — I5032 Chronic diastolic (congestive) heart failure: Secondary | ICD-10-CM | POA: Diagnosis not present

## 2015-10-16 DIAGNOSIS — I10 Essential (primary) hypertension: Secondary | ICD-10-CM | POA: Diagnosis not present

## 2015-10-16 DIAGNOSIS — I5032 Chronic diastolic (congestive) heart failure: Secondary | ICD-10-CM | POA: Diagnosis not present

## 2015-10-16 DIAGNOSIS — M6281 Muscle weakness (generalized): Secondary | ICD-10-CM | POA: Diagnosis not present

## 2015-10-16 DIAGNOSIS — I442 Atrioventricular block, complete: Secondary | ICD-10-CM | POA: Diagnosis not present

## 2015-10-16 DIAGNOSIS — K219 Gastro-esophageal reflux disease without esophagitis: Secondary | ICD-10-CM | POA: Diagnosis not present

## 2015-10-16 DIAGNOSIS — Z4789 Encounter for other orthopedic aftercare: Secondary | ICD-10-CM | POA: Diagnosis not present

## 2015-10-16 DIAGNOSIS — M961 Postlaminectomy syndrome, not elsewhere classified: Secondary | ICD-10-CM | POA: Diagnosis not present

## 2015-10-16 DIAGNOSIS — M4806 Spinal stenosis, lumbar region: Secondary | ICD-10-CM | POA: Diagnosis not present

## 2015-10-16 DIAGNOSIS — R001 Bradycardia, unspecified: Secondary | ICD-10-CM | POA: Diagnosis not present

## 2015-10-16 DIAGNOSIS — R262 Difficulty in walking, not elsewhere classified: Secondary | ICD-10-CM | POA: Diagnosis not present

## 2015-10-16 DIAGNOSIS — M5126 Other intervertebral disc displacement, lumbar region: Secondary | ICD-10-CM | POA: Diagnosis not present

## 2015-10-16 DIAGNOSIS — I629 Nontraumatic intracranial hemorrhage, unspecified: Secondary | ICD-10-CM | POA: Diagnosis not present

## 2015-10-16 DIAGNOSIS — M5440 Lumbago with sciatica, unspecified side: Secondary | ICD-10-CM | POA: Diagnosis not present

## 2015-10-16 DIAGNOSIS — I4891 Unspecified atrial fibrillation: Secondary | ICD-10-CM | POA: Diagnosis not present

## 2015-10-16 DIAGNOSIS — I252 Old myocardial infarction: Secondary | ICD-10-CM | POA: Diagnosis not present

## 2015-10-16 DIAGNOSIS — I2510 Atherosclerotic heart disease of native coronary artery without angina pectoris: Secondary | ICD-10-CM | POA: Diagnosis not present

## 2015-10-16 DIAGNOSIS — E119 Type 2 diabetes mellitus without complications: Secondary | ICD-10-CM | POA: Diagnosis not present

## 2015-10-16 DIAGNOSIS — H902 Conductive hearing loss, unspecified: Secondary | ICD-10-CM | POA: Diagnosis not present

## 2015-10-16 DIAGNOSIS — I059 Rheumatic mitral valve disease, unspecified: Secondary | ICD-10-CM | POA: Diagnosis not present

## 2015-10-17 DIAGNOSIS — M6281 Muscle weakness (generalized): Secondary | ICD-10-CM | POA: Diagnosis not present

## 2015-10-17 DIAGNOSIS — I10 Essential (primary) hypertension: Secondary | ICD-10-CM | POA: Diagnosis not present

## 2015-10-17 DIAGNOSIS — I252 Old myocardial infarction: Secondary | ICD-10-CM | POA: Diagnosis not present

## 2015-10-17 DIAGNOSIS — R262 Difficulty in walking, not elsewhere classified: Secondary | ICD-10-CM | POA: Diagnosis not present

## 2015-10-17 DIAGNOSIS — M4806 Spinal stenosis, lumbar region: Secondary | ICD-10-CM | POA: Diagnosis not present

## 2015-10-17 DIAGNOSIS — I442 Atrioventricular block, complete: Secondary | ICD-10-CM | POA: Diagnosis not present

## 2015-10-17 DIAGNOSIS — I059 Rheumatic mitral valve disease, unspecified: Secondary | ICD-10-CM | POA: Diagnosis not present

## 2015-10-17 DIAGNOSIS — M961 Postlaminectomy syndrome, not elsewhere classified: Secondary | ICD-10-CM | POA: Diagnosis not present

## 2015-10-17 DIAGNOSIS — E119 Type 2 diabetes mellitus without complications: Secondary | ICD-10-CM | POA: Diagnosis not present

## 2015-10-17 DIAGNOSIS — Z4789 Encounter for other orthopedic aftercare: Secondary | ICD-10-CM | POA: Diagnosis not present

## 2015-10-17 DIAGNOSIS — H902 Conductive hearing loss, unspecified: Secondary | ICD-10-CM | POA: Diagnosis not present

## 2015-10-17 DIAGNOSIS — I2510 Atherosclerotic heart disease of native coronary artery without angina pectoris: Secondary | ICD-10-CM | POA: Diagnosis not present

## 2015-10-17 DIAGNOSIS — I4891 Unspecified atrial fibrillation: Secondary | ICD-10-CM | POA: Diagnosis not present

## 2015-10-17 DIAGNOSIS — M5126 Other intervertebral disc displacement, lumbar region: Secondary | ICD-10-CM | POA: Diagnosis not present

## 2015-10-17 DIAGNOSIS — I5032 Chronic diastolic (congestive) heart failure: Secondary | ICD-10-CM | POA: Diagnosis not present

## 2015-10-17 DIAGNOSIS — R001 Bradycardia, unspecified: Secondary | ICD-10-CM | POA: Diagnosis not present

## 2015-10-17 DIAGNOSIS — M5440 Lumbago with sciatica, unspecified side: Secondary | ICD-10-CM | POA: Diagnosis not present

## 2015-10-17 DIAGNOSIS — I629 Nontraumatic intracranial hemorrhage, unspecified: Secondary | ICD-10-CM | POA: Diagnosis not present

## 2015-10-17 DIAGNOSIS — K219 Gastro-esophageal reflux disease without esophagitis: Secondary | ICD-10-CM | POA: Diagnosis not present

## 2015-10-19 DIAGNOSIS — I252 Old myocardial infarction: Secondary | ICD-10-CM | POA: Diagnosis not present

## 2015-10-19 DIAGNOSIS — R001 Bradycardia, unspecified: Secondary | ICD-10-CM | POA: Diagnosis not present

## 2015-10-19 DIAGNOSIS — E119 Type 2 diabetes mellitus without complications: Secondary | ICD-10-CM | POA: Diagnosis not present

## 2015-10-19 DIAGNOSIS — M961 Postlaminectomy syndrome, not elsewhere classified: Secondary | ICD-10-CM | POA: Diagnosis not present

## 2015-10-19 DIAGNOSIS — Z4789 Encounter for other orthopedic aftercare: Secondary | ICD-10-CM | POA: Diagnosis not present

## 2015-10-19 DIAGNOSIS — I4891 Unspecified atrial fibrillation: Secondary | ICD-10-CM | POA: Diagnosis not present

## 2015-10-19 DIAGNOSIS — I059 Rheumatic mitral valve disease, unspecified: Secondary | ICD-10-CM | POA: Diagnosis not present

## 2015-10-19 DIAGNOSIS — M5126 Other intervertebral disc displacement, lumbar region: Secondary | ICD-10-CM | POA: Diagnosis not present

## 2015-10-19 DIAGNOSIS — I5032 Chronic diastolic (congestive) heart failure: Secondary | ICD-10-CM | POA: Diagnosis not present

## 2015-10-19 DIAGNOSIS — M5440 Lumbago with sciatica, unspecified side: Secondary | ICD-10-CM | POA: Diagnosis not present

## 2015-10-19 DIAGNOSIS — M4806 Spinal stenosis, lumbar region: Secondary | ICD-10-CM | POA: Diagnosis not present

## 2015-10-19 DIAGNOSIS — R262 Difficulty in walking, not elsewhere classified: Secondary | ICD-10-CM | POA: Diagnosis not present

## 2015-10-19 DIAGNOSIS — K219 Gastro-esophageal reflux disease without esophagitis: Secondary | ICD-10-CM | POA: Diagnosis not present

## 2015-10-19 DIAGNOSIS — I10 Essential (primary) hypertension: Secondary | ICD-10-CM | POA: Diagnosis not present

## 2015-10-19 DIAGNOSIS — I629 Nontraumatic intracranial hemorrhage, unspecified: Secondary | ICD-10-CM | POA: Diagnosis not present

## 2015-10-19 DIAGNOSIS — I2510 Atherosclerotic heart disease of native coronary artery without angina pectoris: Secondary | ICD-10-CM | POA: Diagnosis not present

## 2015-10-19 DIAGNOSIS — H902 Conductive hearing loss, unspecified: Secondary | ICD-10-CM | POA: Diagnosis not present

## 2015-10-19 DIAGNOSIS — I442 Atrioventricular block, complete: Secondary | ICD-10-CM | POA: Diagnosis not present

## 2015-10-19 DIAGNOSIS — M6281 Muscle weakness (generalized): Secondary | ICD-10-CM | POA: Diagnosis not present

## 2015-10-20 DIAGNOSIS — R262 Difficulty in walking, not elsewhere classified: Secondary | ICD-10-CM | POA: Diagnosis not present

## 2015-10-20 DIAGNOSIS — R001 Bradycardia, unspecified: Secondary | ICD-10-CM | POA: Diagnosis not present

## 2015-10-20 DIAGNOSIS — I059 Rheumatic mitral valve disease, unspecified: Secondary | ICD-10-CM | POA: Diagnosis not present

## 2015-10-20 DIAGNOSIS — M4806 Spinal stenosis, lumbar region: Secondary | ICD-10-CM | POA: Diagnosis not present

## 2015-10-20 DIAGNOSIS — I5032 Chronic diastolic (congestive) heart failure: Secondary | ICD-10-CM | POA: Diagnosis not present

## 2015-10-20 DIAGNOSIS — H902 Conductive hearing loss, unspecified: Secondary | ICD-10-CM | POA: Diagnosis not present

## 2015-10-20 DIAGNOSIS — I10 Essential (primary) hypertension: Secondary | ICD-10-CM | POA: Diagnosis not present

## 2015-10-20 DIAGNOSIS — I629 Nontraumatic intracranial hemorrhage, unspecified: Secondary | ICD-10-CM | POA: Diagnosis not present

## 2015-10-20 DIAGNOSIS — E119 Type 2 diabetes mellitus without complications: Secondary | ICD-10-CM | POA: Diagnosis not present

## 2015-10-20 DIAGNOSIS — Z4789 Encounter for other orthopedic aftercare: Secondary | ICD-10-CM | POA: Diagnosis not present

## 2015-10-20 DIAGNOSIS — I252 Old myocardial infarction: Secondary | ICD-10-CM | POA: Diagnosis not present

## 2015-10-20 DIAGNOSIS — I4891 Unspecified atrial fibrillation: Secondary | ICD-10-CM | POA: Diagnosis not present

## 2015-10-20 DIAGNOSIS — I2510 Atherosclerotic heart disease of native coronary artery without angina pectoris: Secondary | ICD-10-CM | POA: Diagnosis not present

## 2015-10-20 DIAGNOSIS — M5126 Other intervertebral disc displacement, lumbar region: Secondary | ICD-10-CM | POA: Diagnosis not present

## 2015-10-20 DIAGNOSIS — K219 Gastro-esophageal reflux disease without esophagitis: Secondary | ICD-10-CM | POA: Diagnosis not present

## 2015-10-20 DIAGNOSIS — M961 Postlaminectomy syndrome, not elsewhere classified: Secondary | ICD-10-CM | POA: Diagnosis not present

## 2015-10-20 DIAGNOSIS — M6281 Muscle weakness (generalized): Secondary | ICD-10-CM | POA: Diagnosis not present

## 2015-10-20 DIAGNOSIS — M5440 Lumbago with sciatica, unspecified side: Secondary | ICD-10-CM | POA: Diagnosis not present

## 2015-10-20 DIAGNOSIS — I442 Atrioventricular block, complete: Secondary | ICD-10-CM | POA: Diagnosis not present

## 2015-10-23 DIAGNOSIS — I5032 Chronic diastolic (congestive) heart failure: Secondary | ICD-10-CM | POA: Diagnosis not present

## 2015-10-23 DIAGNOSIS — M4806 Spinal stenosis, lumbar region: Secondary | ICD-10-CM | POA: Diagnosis not present

## 2015-10-23 DIAGNOSIS — M5126 Other intervertebral disc displacement, lumbar region: Secondary | ICD-10-CM | POA: Diagnosis not present

## 2015-10-23 DIAGNOSIS — K219 Gastro-esophageal reflux disease without esophagitis: Secondary | ICD-10-CM | POA: Diagnosis not present

## 2015-10-23 DIAGNOSIS — I4891 Unspecified atrial fibrillation: Secondary | ICD-10-CM | POA: Diagnosis not present

## 2015-10-23 DIAGNOSIS — Z4789 Encounter for other orthopedic aftercare: Secondary | ICD-10-CM | POA: Diagnosis not present

## 2015-10-23 DIAGNOSIS — I10 Essential (primary) hypertension: Secondary | ICD-10-CM | POA: Diagnosis not present

## 2015-10-23 DIAGNOSIS — I442 Atrioventricular block, complete: Secondary | ICD-10-CM | POA: Diagnosis not present

## 2015-10-23 DIAGNOSIS — M961 Postlaminectomy syndrome, not elsewhere classified: Secondary | ICD-10-CM | POA: Diagnosis not present

## 2015-10-23 DIAGNOSIS — H902 Conductive hearing loss, unspecified: Secondary | ICD-10-CM | POA: Diagnosis not present

## 2015-10-23 DIAGNOSIS — M6281 Muscle weakness (generalized): Secondary | ICD-10-CM | POA: Diagnosis not present

## 2015-10-23 DIAGNOSIS — I2510 Atherosclerotic heart disease of native coronary artery without angina pectoris: Secondary | ICD-10-CM | POA: Diagnosis not present

## 2015-10-23 DIAGNOSIS — E119 Type 2 diabetes mellitus without complications: Secondary | ICD-10-CM | POA: Diagnosis not present

## 2015-10-23 DIAGNOSIS — I252 Old myocardial infarction: Secondary | ICD-10-CM | POA: Diagnosis not present

## 2015-10-23 DIAGNOSIS — I629 Nontraumatic intracranial hemorrhage, unspecified: Secondary | ICD-10-CM | POA: Diagnosis not present

## 2015-10-23 DIAGNOSIS — M5440 Lumbago with sciatica, unspecified side: Secondary | ICD-10-CM | POA: Diagnosis not present

## 2015-10-23 DIAGNOSIS — R001 Bradycardia, unspecified: Secondary | ICD-10-CM | POA: Diagnosis not present

## 2015-10-23 DIAGNOSIS — I059 Rheumatic mitral valve disease, unspecified: Secondary | ICD-10-CM | POA: Diagnosis not present

## 2015-10-23 DIAGNOSIS — R262 Difficulty in walking, not elsewhere classified: Secondary | ICD-10-CM | POA: Diagnosis not present

## 2015-10-24 DIAGNOSIS — I10 Essential (primary) hypertension: Secondary | ICD-10-CM | POA: Diagnosis not present

## 2015-10-24 DIAGNOSIS — I059 Rheumatic mitral valve disease, unspecified: Secondary | ICD-10-CM | POA: Diagnosis not present

## 2015-10-24 DIAGNOSIS — K219 Gastro-esophageal reflux disease without esophagitis: Secondary | ICD-10-CM | POA: Diagnosis not present

## 2015-10-24 DIAGNOSIS — R262 Difficulty in walking, not elsewhere classified: Secondary | ICD-10-CM | POA: Diagnosis not present

## 2015-10-24 DIAGNOSIS — I5032 Chronic diastolic (congestive) heart failure: Secondary | ICD-10-CM | POA: Diagnosis not present

## 2015-10-24 DIAGNOSIS — I4891 Unspecified atrial fibrillation: Secondary | ICD-10-CM | POA: Diagnosis not present

## 2015-10-24 DIAGNOSIS — I629 Nontraumatic intracranial hemorrhage, unspecified: Secondary | ICD-10-CM | POA: Diagnosis not present

## 2015-10-24 DIAGNOSIS — E119 Type 2 diabetes mellitus without complications: Secondary | ICD-10-CM | POA: Diagnosis not present

## 2015-10-24 DIAGNOSIS — H902 Conductive hearing loss, unspecified: Secondary | ICD-10-CM | POA: Diagnosis not present

## 2015-10-24 DIAGNOSIS — R001 Bradycardia, unspecified: Secondary | ICD-10-CM | POA: Diagnosis not present

## 2015-10-24 DIAGNOSIS — I2510 Atherosclerotic heart disease of native coronary artery without angina pectoris: Secondary | ICD-10-CM | POA: Diagnosis not present

## 2015-10-24 DIAGNOSIS — I442 Atrioventricular block, complete: Secondary | ICD-10-CM | POA: Diagnosis not present

## 2015-10-24 DIAGNOSIS — M6281 Muscle weakness (generalized): Secondary | ICD-10-CM | POA: Diagnosis not present

## 2015-10-24 DIAGNOSIS — M961 Postlaminectomy syndrome, not elsewhere classified: Secondary | ICD-10-CM | POA: Diagnosis not present

## 2015-10-24 DIAGNOSIS — M4806 Spinal stenosis, lumbar region: Secondary | ICD-10-CM | POA: Diagnosis not present

## 2015-10-24 DIAGNOSIS — I252 Old myocardial infarction: Secondary | ICD-10-CM | POA: Diagnosis not present

## 2015-10-24 DIAGNOSIS — M5440 Lumbago with sciatica, unspecified side: Secondary | ICD-10-CM | POA: Diagnosis not present

## 2015-10-24 DIAGNOSIS — M5126 Other intervertebral disc displacement, lumbar region: Secondary | ICD-10-CM | POA: Diagnosis not present

## 2015-10-24 DIAGNOSIS — Z4789 Encounter for other orthopedic aftercare: Secondary | ICD-10-CM | POA: Diagnosis not present

## 2015-10-25 DIAGNOSIS — I252 Old myocardial infarction: Secondary | ICD-10-CM | POA: Diagnosis not present

## 2015-10-25 DIAGNOSIS — H902 Conductive hearing loss, unspecified: Secondary | ICD-10-CM | POA: Diagnosis not present

## 2015-10-25 DIAGNOSIS — K219 Gastro-esophageal reflux disease without esophagitis: Secondary | ICD-10-CM | POA: Diagnosis not present

## 2015-10-25 DIAGNOSIS — I5032 Chronic diastolic (congestive) heart failure: Secondary | ICD-10-CM | POA: Diagnosis not present

## 2015-10-25 DIAGNOSIS — M5126 Other intervertebral disc displacement, lumbar region: Secondary | ICD-10-CM | POA: Diagnosis not present

## 2015-10-25 DIAGNOSIS — I059 Rheumatic mitral valve disease, unspecified: Secondary | ICD-10-CM | POA: Diagnosis not present

## 2015-10-25 DIAGNOSIS — I442 Atrioventricular block, complete: Secondary | ICD-10-CM | POA: Diagnosis not present

## 2015-10-25 DIAGNOSIS — I10 Essential (primary) hypertension: Secondary | ICD-10-CM | POA: Diagnosis not present

## 2015-10-25 DIAGNOSIS — R262 Difficulty in walking, not elsewhere classified: Secondary | ICD-10-CM | POA: Diagnosis not present

## 2015-10-25 DIAGNOSIS — E119 Type 2 diabetes mellitus without complications: Secondary | ICD-10-CM | POA: Diagnosis not present

## 2015-10-25 DIAGNOSIS — Z4789 Encounter for other orthopedic aftercare: Secondary | ICD-10-CM | POA: Diagnosis not present

## 2015-10-25 DIAGNOSIS — M4806 Spinal stenosis, lumbar region: Secondary | ICD-10-CM | POA: Diagnosis not present

## 2015-10-25 DIAGNOSIS — I2510 Atherosclerotic heart disease of native coronary artery without angina pectoris: Secondary | ICD-10-CM | POA: Diagnosis not present

## 2015-10-25 DIAGNOSIS — R001 Bradycardia, unspecified: Secondary | ICD-10-CM | POA: Diagnosis not present

## 2015-10-25 DIAGNOSIS — M961 Postlaminectomy syndrome, not elsewhere classified: Secondary | ICD-10-CM | POA: Diagnosis not present

## 2015-10-25 DIAGNOSIS — M5440 Lumbago with sciatica, unspecified side: Secondary | ICD-10-CM | POA: Diagnosis not present

## 2015-10-25 DIAGNOSIS — I4891 Unspecified atrial fibrillation: Secondary | ICD-10-CM | POA: Diagnosis not present

## 2015-10-25 DIAGNOSIS — M6281 Muscle weakness (generalized): Secondary | ICD-10-CM | POA: Diagnosis not present

## 2015-10-25 DIAGNOSIS — I629 Nontraumatic intracranial hemorrhage, unspecified: Secondary | ICD-10-CM | POA: Diagnosis not present

## 2015-10-27 DIAGNOSIS — Z4889 Encounter for other specified surgical aftercare: Secondary | ICD-10-CM | POA: Diagnosis not present

## 2015-10-27 DIAGNOSIS — M79662 Pain in left lower leg: Secondary | ICD-10-CM | POA: Diagnosis not present

## 2015-10-27 DIAGNOSIS — Z87891 Personal history of nicotine dependence: Secondary | ICD-10-CM | POA: Diagnosis not present

## 2015-10-27 DIAGNOSIS — M545 Low back pain: Secondary | ICD-10-CM | POA: Diagnosis not present

## 2015-11-01 DIAGNOSIS — K219 Gastro-esophageal reflux disease without esophagitis: Secondary | ICD-10-CM | POA: Diagnosis not present

## 2015-11-01 DIAGNOSIS — I252 Old myocardial infarction: Secondary | ICD-10-CM | POA: Diagnosis not present

## 2015-11-01 DIAGNOSIS — I442 Atrioventricular block, complete: Secondary | ICD-10-CM | POA: Diagnosis not present

## 2015-11-01 DIAGNOSIS — I5032 Chronic diastolic (congestive) heart failure: Secondary | ICD-10-CM | POA: Diagnosis not present

## 2015-11-01 DIAGNOSIS — H902 Conductive hearing loss, unspecified: Secondary | ICD-10-CM | POA: Diagnosis not present

## 2015-11-01 DIAGNOSIS — E119 Type 2 diabetes mellitus without complications: Secondary | ICD-10-CM | POA: Diagnosis not present

## 2015-11-01 DIAGNOSIS — M961 Postlaminectomy syndrome, not elsewhere classified: Secondary | ICD-10-CM | POA: Diagnosis not present

## 2015-11-01 DIAGNOSIS — M4806 Spinal stenosis, lumbar region: Secondary | ICD-10-CM | POA: Diagnosis not present

## 2015-11-01 DIAGNOSIS — Z4789 Encounter for other orthopedic aftercare: Secondary | ICD-10-CM | POA: Diagnosis not present

## 2015-11-01 DIAGNOSIS — I10 Essential (primary) hypertension: Secondary | ICD-10-CM | POA: Diagnosis not present

## 2015-11-01 DIAGNOSIS — I629 Nontraumatic intracranial hemorrhage, unspecified: Secondary | ICD-10-CM | POA: Diagnosis not present

## 2015-11-01 DIAGNOSIS — I059 Rheumatic mitral valve disease, unspecified: Secondary | ICD-10-CM | POA: Diagnosis not present

## 2015-11-01 DIAGNOSIS — R001 Bradycardia, unspecified: Secondary | ICD-10-CM | POA: Diagnosis not present

## 2015-11-01 DIAGNOSIS — M6281 Muscle weakness (generalized): Secondary | ICD-10-CM | POA: Diagnosis not present

## 2015-11-01 DIAGNOSIS — I4891 Unspecified atrial fibrillation: Secondary | ICD-10-CM | POA: Diagnosis not present

## 2015-11-01 DIAGNOSIS — R262 Difficulty in walking, not elsewhere classified: Secondary | ICD-10-CM | POA: Diagnosis not present

## 2015-11-01 DIAGNOSIS — M5440 Lumbago with sciatica, unspecified side: Secondary | ICD-10-CM | POA: Diagnosis not present

## 2015-11-01 DIAGNOSIS — M5126 Other intervertebral disc displacement, lumbar region: Secondary | ICD-10-CM | POA: Diagnosis not present

## 2015-11-01 DIAGNOSIS — I2510 Atherosclerotic heart disease of native coronary artery without angina pectoris: Secondary | ICD-10-CM | POA: Diagnosis not present

## 2015-11-13 DIAGNOSIS — M5126 Other intervertebral disc displacement, lumbar region: Secondary | ICD-10-CM | POA: Diagnosis not present

## 2015-11-13 DIAGNOSIS — M4806 Spinal stenosis, lumbar region: Secondary | ICD-10-CM | POA: Diagnosis not present

## 2015-11-13 DIAGNOSIS — Z4889 Encounter for other specified surgical aftercare: Secondary | ICD-10-CM | POA: Diagnosis not present

## 2015-11-16 DIAGNOSIS — H903 Sensorineural hearing loss, bilateral: Secondary | ICD-10-CM | POA: Diagnosis not present

## 2015-12-12 DIAGNOSIS — E785 Hyperlipidemia, unspecified: Secondary | ICD-10-CM | POA: Diagnosis not present

## 2015-12-12 DIAGNOSIS — E1129 Type 2 diabetes mellitus with other diabetic kidney complication: Secondary | ICD-10-CM | POA: Diagnosis not present

## 2015-12-12 DIAGNOSIS — N189 Chronic kidney disease, unspecified: Secondary | ICD-10-CM | POA: Diagnosis not present

## 2015-12-16 DIAGNOSIS — I2510 Atherosclerotic heart disease of native coronary artery without angina pectoris: Secondary | ICD-10-CM | POA: Diagnosis not present

## 2015-12-16 DIAGNOSIS — E785 Hyperlipidemia, unspecified: Secondary | ICD-10-CM | POA: Diagnosis not present

## 2015-12-16 DIAGNOSIS — I130 Hypertensive heart and chronic kidney disease with heart failure and stage 1 through stage 4 chronic kidney disease, or unspecified chronic kidney disease: Secondary | ICD-10-CM | POA: Diagnosis not present

## 2015-12-16 DIAGNOSIS — E1129 Type 2 diabetes mellitus with other diabetic kidney complication: Secondary | ICD-10-CM | POA: Diagnosis not present

## 2015-12-16 DIAGNOSIS — N189 Chronic kidney disease, unspecified: Secondary | ICD-10-CM | POA: Diagnosis not present

## 2016-01-11 DIAGNOSIS — M5136 Other intervertebral disc degeneration, lumbar region: Secondary | ICD-10-CM | POA: Diagnosis not present

## 2016-01-11 DIAGNOSIS — M4722 Other spondylosis with radiculopathy, cervical region: Secondary | ICD-10-CM | POA: Diagnosis not present

## 2016-01-11 DIAGNOSIS — M5090 Cervical disc disorder, unspecified, unspecified cervical region: Secondary | ICD-10-CM | POA: Diagnosis not present

## 2016-01-23 DIAGNOSIS — M25612 Stiffness of left shoulder, not elsewhere classified: Secondary | ICD-10-CM | POA: Diagnosis not present

## 2016-01-23 DIAGNOSIS — M542 Cervicalgia: Secondary | ICD-10-CM | POA: Diagnosis not present

## 2016-01-23 DIAGNOSIS — R293 Abnormal posture: Secondary | ICD-10-CM | POA: Diagnosis not present

## 2016-01-23 DIAGNOSIS — M25611 Stiffness of right shoulder, not elsewhere classified: Secondary | ICD-10-CM | POA: Diagnosis not present

## 2016-01-23 DIAGNOSIS — M5030 Other cervical disc degeneration, unspecified cervical region: Secondary | ICD-10-CM | POA: Diagnosis not present

## 2016-01-23 DIAGNOSIS — M2560 Stiffness of unspecified joint, not elsewhere classified: Secondary | ICD-10-CM | POA: Diagnosis not present

## 2016-01-23 DIAGNOSIS — M6281 Muscle weakness (generalized): Secondary | ICD-10-CM | POA: Diagnosis not present

## 2016-01-23 DIAGNOSIS — R51 Headache: Secondary | ICD-10-CM | POA: Diagnosis not present

## 2016-01-25 DIAGNOSIS — R51 Headache: Secondary | ICD-10-CM | POA: Diagnosis not present

## 2016-01-25 DIAGNOSIS — M25611 Stiffness of right shoulder, not elsewhere classified: Secondary | ICD-10-CM | POA: Diagnosis not present

## 2016-01-25 DIAGNOSIS — M5030 Other cervical disc degeneration, unspecified cervical region: Secondary | ICD-10-CM | POA: Diagnosis not present

## 2016-01-25 DIAGNOSIS — M6281 Muscle weakness (generalized): Secondary | ICD-10-CM | POA: Diagnosis not present

## 2016-01-25 DIAGNOSIS — R293 Abnormal posture: Secondary | ICD-10-CM | POA: Diagnosis not present

## 2016-01-25 DIAGNOSIS — M542 Cervicalgia: Secondary | ICD-10-CM | POA: Diagnosis not present

## 2016-01-25 DIAGNOSIS — M25612 Stiffness of left shoulder, not elsewhere classified: Secondary | ICD-10-CM | POA: Diagnosis not present

## 2016-01-25 DIAGNOSIS — M2560 Stiffness of unspecified joint, not elsewhere classified: Secondary | ICD-10-CM | POA: Diagnosis not present

## 2016-01-30 DIAGNOSIS — M5030 Other cervical disc degeneration, unspecified cervical region: Secondary | ICD-10-CM | POA: Diagnosis not present

## 2016-01-30 DIAGNOSIS — M25611 Stiffness of right shoulder, not elsewhere classified: Secondary | ICD-10-CM | POA: Diagnosis not present

## 2016-01-30 DIAGNOSIS — M2560 Stiffness of unspecified joint, not elsewhere classified: Secondary | ICD-10-CM | POA: Diagnosis not present

## 2016-01-30 DIAGNOSIS — M542 Cervicalgia: Secondary | ICD-10-CM | POA: Diagnosis not present

## 2016-01-30 DIAGNOSIS — M6281 Muscle weakness (generalized): Secondary | ICD-10-CM | POA: Diagnosis not present

## 2016-01-30 DIAGNOSIS — M25612 Stiffness of left shoulder, not elsewhere classified: Secondary | ICD-10-CM | POA: Diagnosis not present

## 2016-01-30 DIAGNOSIS — R51 Headache: Secondary | ICD-10-CM | POA: Diagnosis not present

## 2016-01-30 DIAGNOSIS — R293 Abnormal posture: Secondary | ICD-10-CM | POA: Diagnosis not present

## 2016-02-02 DIAGNOSIS — M6281 Muscle weakness (generalized): Secondary | ICD-10-CM | POA: Diagnosis not present

## 2016-02-02 DIAGNOSIS — R51 Headache: Secondary | ICD-10-CM | POA: Diagnosis not present

## 2016-02-02 DIAGNOSIS — R293 Abnormal posture: Secondary | ICD-10-CM | POA: Diagnosis not present

## 2016-02-02 DIAGNOSIS — M2560 Stiffness of unspecified joint, not elsewhere classified: Secondary | ICD-10-CM | POA: Diagnosis not present

## 2016-02-02 DIAGNOSIS — M25612 Stiffness of left shoulder, not elsewhere classified: Secondary | ICD-10-CM | POA: Diagnosis not present

## 2016-02-02 DIAGNOSIS — M542 Cervicalgia: Secondary | ICD-10-CM | POA: Diagnosis not present

## 2016-02-02 DIAGNOSIS — M25611 Stiffness of right shoulder, not elsewhere classified: Secondary | ICD-10-CM | POA: Diagnosis not present

## 2016-02-02 DIAGNOSIS — M5030 Other cervical disc degeneration, unspecified cervical region: Secondary | ICD-10-CM | POA: Diagnosis not present

## 2016-02-08 DIAGNOSIS — R293 Abnormal posture: Secondary | ICD-10-CM | POA: Diagnosis not present

## 2016-02-08 DIAGNOSIS — M25612 Stiffness of left shoulder, not elsewhere classified: Secondary | ICD-10-CM | POA: Diagnosis not present

## 2016-02-08 DIAGNOSIS — M25611 Stiffness of right shoulder, not elsewhere classified: Secondary | ICD-10-CM | POA: Diagnosis not present

## 2016-02-08 DIAGNOSIS — R51 Headache: Secondary | ICD-10-CM | POA: Diagnosis not present

## 2016-02-08 DIAGNOSIS — M5090 Cervical disc disorder, unspecified, unspecified cervical region: Secondary | ICD-10-CM | POA: Diagnosis not present

## 2016-02-08 DIAGNOSIS — M6281 Muscle weakness (generalized): Secondary | ICD-10-CM | POA: Diagnosis not present

## 2016-02-08 DIAGNOSIS — M2560 Stiffness of unspecified joint, not elsewhere classified: Secondary | ICD-10-CM | POA: Diagnosis not present

## 2016-02-09 DIAGNOSIS — H938X3 Other specified disorders of ear, bilateral: Secondary | ICD-10-CM | POA: Diagnosis not present

## 2016-02-09 DIAGNOSIS — H903 Sensorineural hearing loss, bilateral: Secondary | ICD-10-CM | POA: Diagnosis not present

## 2016-02-09 DIAGNOSIS — H6123 Impacted cerumen, bilateral: Secondary | ICD-10-CM | POA: Diagnosis not present

## 2016-02-13 DIAGNOSIS — R293 Abnormal posture: Secondary | ICD-10-CM | POA: Diagnosis not present

## 2016-02-13 DIAGNOSIS — M25612 Stiffness of left shoulder, not elsewhere classified: Secondary | ICD-10-CM | POA: Diagnosis not present

## 2016-02-13 DIAGNOSIS — R51 Headache: Secondary | ICD-10-CM | POA: Diagnosis not present

## 2016-02-13 DIAGNOSIS — M5090 Cervical disc disorder, unspecified, unspecified cervical region: Secondary | ICD-10-CM | POA: Diagnosis not present

## 2016-02-13 DIAGNOSIS — M25611 Stiffness of right shoulder, not elsewhere classified: Secondary | ICD-10-CM | POA: Diagnosis not present

## 2016-02-13 DIAGNOSIS — M6281 Muscle weakness (generalized): Secondary | ICD-10-CM | POA: Diagnosis not present

## 2016-02-13 DIAGNOSIS — M2560 Stiffness of unspecified joint, not elsewhere classified: Secondary | ICD-10-CM | POA: Diagnosis not present

## 2016-02-14 DIAGNOSIS — M25611 Stiffness of right shoulder, not elsewhere classified: Secondary | ICD-10-CM | POA: Diagnosis not present

## 2016-02-14 DIAGNOSIS — R51 Headache: Secondary | ICD-10-CM | POA: Diagnosis not present

## 2016-02-14 DIAGNOSIS — M25612 Stiffness of left shoulder, not elsewhere classified: Secondary | ICD-10-CM | POA: Diagnosis not present

## 2016-02-14 DIAGNOSIS — M2560 Stiffness of unspecified joint, not elsewhere classified: Secondary | ICD-10-CM | POA: Diagnosis not present

## 2016-02-14 DIAGNOSIS — R293 Abnormal posture: Secondary | ICD-10-CM | POA: Diagnosis not present

## 2016-02-14 DIAGNOSIS — M5090 Cervical disc disorder, unspecified, unspecified cervical region: Secondary | ICD-10-CM | POA: Diagnosis not present

## 2016-02-14 DIAGNOSIS — M6281 Muscle weakness (generalized): Secondary | ICD-10-CM | POA: Diagnosis not present

## 2016-02-19 DIAGNOSIS — S51802A Unspecified open wound of left forearm, initial encounter: Secondary | ICD-10-CM | POA: Diagnosis not present

## 2016-02-19 DIAGNOSIS — M545 Low back pain: Secondary | ICD-10-CM | POA: Diagnosis not present

## 2016-02-21 DIAGNOSIS — R293 Abnormal posture: Secondary | ICD-10-CM | POA: Diagnosis not present

## 2016-02-21 DIAGNOSIS — R51 Headache: Secondary | ICD-10-CM | POA: Diagnosis not present

## 2016-02-21 DIAGNOSIS — M25612 Stiffness of left shoulder, not elsewhere classified: Secondary | ICD-10-CM | POA: Diagnosis not present

## 2016-02-21 DIAGNOSIS — M25611 Stiffness of right shoulder, not elsewhere classified: Secondary | ICD-10-CM | POA: Diagnosis not present

## 2016-02-21 DIAGNOSIS — M6281 Muscle weakness (generalized): Secondary | ICD-10-CM | POA: Diagnosis not present

## 2016-02-21 DIAGNOSIS — M5090 Cervical disc disorder, unspecified, unspecified cervical region: Secondary | ICD-10-CM | POA: Diagnosis not present

## 2016-02-21 DIAGNOSIS — M2560 Stiffness of unspecified joint, not elsewhere classified: Secondary | ICD-10-CM | POA: Diagnosis not present

## 2016-02-23 DIAGNOSIS — R293 Abnormal posture: Secondary | ICD-10-CM | POA: Diagnosis not present

## 2016-02-23 DIAGNOSIS — M2560 Stiffness of unspecified joint, not elsewhere classified: Secondary | ICD-10-CM | POA: Diagnosis not present

## 2016-02-23 DIAGNOSIS — M5090 Cervical disc disorder, unspecified, unspecified cervical region: Secondary | ICD-10-CM | POA: Diagnosis not present

## 2016-02-23 DIAGNOSIS — R51 Headache: Secondary | ICD-10-CM | POA: Diagnosis not present

## 2016-02-23 DIAGNOSIS — M25612 Stiffness of left shoulder, not elsewhere classified: Secondary | ICD-10-CM | POA: Diagnosis not present

## 2016-02-23 DIAGNOSIS — M25611 Stiffness of right shoulder, not elsewhere classified: Secondary | ICD-10-CM | POA: Diagnosis not present

## 2016-02-23 DIAGNOSIS — M6281 Muscle weakness (generalized): Secondary | ICD-10-CM | POA: Diagnosis not present

## 2016-02-28 DIAGNOSIS — M5090 Cervical disc disorder, unspecified, unspecified cervical region: Secondary | ICD-10-CM | POA: Diagnosis not present

## 2016-02-28 DIAGNOSIS — M25611 Stiffness of right shoulder, not elsewhere classified: Secondary | ICD-10-CM | POA: Diagnosis not present

## 2016-02-28 DIAGNOSIS — M2560 Stiffness of unspecified joint, not elsewhere classified: Secondary | ICD-10-CM | POA: Diagnosis not present

## 2016-02-28 DIAGNOSIS — M25612 Stiffness of left shoulder, not elsewhere classified: Secondary | ICD-10-CM | POA: Diagnosis not present

## 2016-02-28 DIAGNOSIS — R51 Headache: Secondary | ICD-10-CM | POA: Diagnosis not present

## 2016-02-28 DIAGNOSIS — J208 Acute bronchitis due to other specified organisms: Secondary | ICD-10-CM | POA: Diagnosis not present

## 2016-02-28 DIAGNOSIS — M6281 Muscle weakness (generalized): Secondary | ICD-10-CM | POA: Diagnosis not present

## 2016-02-28 DIAGNOSIS — R293 Abnormal posture: Secondary | ICD-10-CM | POA: Diagnosis not present

## 2016-03-01 DIAGNOSIS — M25611 Stiffness of right shoulder, not elsewhere classified: Secondary | ICD-10-CM | POA: Diagnosis not present

## 2016-03-01 DIAGNOSIS — R51 Headache: Secondary | ICD-10-CM | POA: Diagnosis not present

## 2016-03-01 DIAGNOSIS — R293 Abnormal posture: Secondary | ICD-10-CM | POA: Diagnosis not present

## 2016-03-01 DIAGNOSIS — M2560 Stiffness of unspecified joint, not elsewhere classified: Secondary | ICD-10-CM | POA: Diagnosis not present

## 2016-03-01 DIAGNOSIS — M6281 Muscle weakness (generalized): Secondary | ICD-10-CM | POA: Diagnosis not present

## 2016-03-01 DIAGNOSIS — M25612 Stiffness of left shoulder, not elsewhere classified: Secondary | ICD-10-CM | POA: Diagnosis not present

## 2016-03-01 DIAGNOSIS — M5090 Cervical disc disorder, unspecified, unspecified cervical region: Secondary | ICD-10-CM | POA: Diagnosis not present

## 2016-03-06 DIAGNOSIS — M5090 Cervical disc disorder, unspecified, unspecified cervical region: Secondary | ICD-10-CM | POA: Diagnosis not present

## 2016-03-08 DIAGNOSIS — M5090 Cervical disc disorder, unspecified, unspecified cervical region: Secondary | ICD-10-CM | POA: Diagnosis not present

## 2016-03-13 DIAGNOSIS — N189 Chronic kidney disease, unspecified: Secondary | ICD-10-CM | POA: Diagnosis not present

## 2016-03-13 DIAGNOSIS — E1129 Type 2 diabetes mellitus with other diabetic kidney complication: Secondary | ICD-10-CM | POA: Diagnosis not present

## 2016-03-13 DIAGNOSIS — E785 Hyperlipidemia, unspecified: Secondary | ICD-10-CM | POA: Diagnosis not present

## 2016-03-14 DIAGNOSIS — Z45018 Encounter for adjustment and management of other part of cardiac pacemaker: Secondary | ICD-10-CM | POA: Diagnosis not present

## 2016-03-16 DIAGNOSIS — Z9181 History of falling: Secondary | ICD-10-CM | POA: Diagnosis not present

## 2016-03-16 DIAGNOSIS — I130 Hypertensive heart and chronic kidney disease with heart failure and stage 1 through stage 4 chronic kidney disease, or unspecified chronic kidney disease: Secondary | ICD-10-CM | POA: Diagnosis not present

## 2016-03-16 DIAGNOSIS — Z1389 Encounter for screening for other disorder: Secondary | ICD-10-CM | POA: Diagnosis not present

## 2016-03-16 DIAGNOSIS — I2510 Atherosclerotic heart disease of native coronary artery without angina pectoris: Secondary | ICD-10-CM | POA: Diagnosis not present

## 2016-03-16 DIAGNOSIS — E1129 Type 2 diabetes mellitus with other diabetic kidney complication: Secondary | ICD-10-CM | POA: Diagnosis not present

## 2016-03-16 DIAGNOSIS — N189 Chronic kidney disease, unspecified: Secondary | ICD-10-CM | POA: Diagnosis not present

## 2016-03-16 DIAGNOSIS — H6121 Impacted cerumen, right ear: Secondary | ICD-10-CM | POA: Diagnosis not present

## 2016-04-18 DIAGNOSIS — Z794 Long term (current) use of insulin: Secondary | ICD-10-CM | POA: Diagnosis not present

## 2016-04-18 DIAGNOSIS — Z9889 Other specified postprocedural states: Secondary | ICD-10-CM

## 2016-04-18 DIAGNOSIS — Z7982 Long term (current) use of aspirin: Secondary | ICD-10-CM | POA: Diagnosis not present

## 2016-04-18 DIAGNOSIS — Z79899 Other long term (current) drug therapy: Secondary | ICD-10-CM | POA: Diagnosis not present

## 2016-04-18 DIAGNOSIS — I252 Old myocardial infarction: Secondary | ICD-10-CM | POA: Diagnosis not present

## 2016-04-18 DIAGNOSIS — M25552 Pain in left hip: Secondary | ICD-10-CM | POA: Diagnosis not present

## 2016-04-18 DIAGNOSIS — M5442 Lumbago with sciatica, left side: Secondary | ICD-10-CM | POA: Diagnosis not present

## 2016-04-18 DIAGNOSIS — I10 Essential (primary) hypertension: Secondary | ICD-10-CM | POA: Diagnosis not present

## 2016-04-18 DIAGNOSIS — E119 Type 2 diabetes mellitus without complications: Secondary | ICD-10-CM | POA: Diagnosis not present

## 2016-04-18 DIAGNOSIS — Z87891 Personal history of nicotine dependence: Secondary | ICD-10-CM | POA: Diagnosis not present

## 2016-04-18 DIAGNOSIS — G8929 Other chronic pain: Secondary | ICD-10-CM

## 2016-04-18 HISTORY — DX: Other chronic pain: G89.29

## 2016-04-18 HISTORY — DX: Other specified postprocedural states: Z98.890

## 2016-04-25 DIAGNOSIS — R293 Abnormal posture: Secondary | ICD-10-CM | POA: Diagnosis not present

## 2016-04-25 DIAGNOSIS — R2689 Other abnormalities of gait and mobility: Secondary | ICD-10-CM | POA: Diagnosis not present

## 2016-04-25 DIAGNOSIS — M79605 Pain in left leg: Secondary | ICD-10-CM | POA: Diagnosis not present

## 2016-04-25 DIAGNOSIS — M5442 Lumbago with sciatica, left side: Secondary | ICD-10-CM | POA: Diagnosis not present

## 2016-04-25 DIAGNOSIS — M545 Low back pain: Secondary | ICD-10-CM | POA: Diagnosis not present

## 2016-04-25 DIAGNOSIS — E1129 Type 2 diabetes mellitus with other diabetic kidney complication: Secondary | ICD-10-CM | POA: Diagnosis not present

## 2016-04-25 DIAGNOSIS — I130 Hypertensive heart and chronic kidney disease with heart failure and stage 1 through stage 4 chronic kidney disease, or unspecified chronic kidney disease: Secondary | ICD-10-CM | POA: Diagnosis not present

## 2016-04-25 DIAGNOSIS — S20212A Contusion of left front wall of thorax, initial encounter: Secondary | ICD-10-CM | POA: Diagnosis not present

## 2016-04-25 DIAGNOSIS — M25552 Pain in left hip: Secondary | ICD-10-CM | POA: Diagnosis not present

## 2016-04-25 DIAGNOSIS — G8929 Other chronic pain: Secondary | ICD-10-CM | POA: Diagnosis not present

## 2016-04-25 DIAGNOSIS — M2560 Stiffness of unspecified joint, not elsewhere classified: Secondary | ICD-10-CM | POA: Diagnosis not present

## 2016-04-25 DIAGNOSIS — M6281 Muscle weakness (generalized): Secondary | ICD-10-CM | POA: Diagnosis not present

## 2016-05-01 DIAGNOSIS — M2560 Stiffness of unspecified joint, not elsewhere classified: Secondary | ICD-10-CM | POA: Diagnosis not present

## 2016-05-01 DIAGNOSIS — M545 Low back pain: Secondary | ICD-10-CM | POA: Diagnosis not present

## 2016-05-01 DIAGNOSIS — M79605 Pain in left leg: Secondary | ICD-10-CM | POA: Diagnosis not present

## 2016-05-01 DIAGNOSIS — G8929 Other chronic pain: Secondary | ICD-10-CM | POA: Diagnosis not present

## 2016-05-01 DIAGNOSIS — M6281 Muscle weakness (generalized): Secondary | ICD-10-CM | POA: Diagnosis not present

## 2016-05-01 DIAGNOSIS — R293 Abnormal posture: Secondary | ICD-10-CM | POA: Diagnosis not present

## 2016-05-01 DIAGNOSIS — R2689 Other abnormalities of gait and mobility: Secondary | ICD-10-CM | POA: Diagnosis not present

## 2016-05-01 DIAGNOSIS — M25552 Pain in left hip: Secondary | ICD-10-CM | POA: Diagnosis not present

## 2016-05-01 DIAGNOSIS — M5442 Lumbago with sciatica, left side: Secondary | ICD-10-CM | POA: Diagnosis not present

## 2016-05-10 DIAGNOSIS — M25552 Pain in left hip: Secondary | ICD-10-CM | POA: Diagnosis not present

## 2016-05-10 DIAGNOSIS — R2689 Other abnormalities of gait and mobility: Secondary | ICD-10-CM | POA: Diagnosis not present

## 2016-05-10 DIAGNOSIS — R293 Abnormal posture: Secondary | ICD-10-CM | POA: Diagnosis not present

## 2016-05-10 DIAGNOSIS — M5442 Lumbago with sciatica, left side: Secondary | ICD-10-CM | POA: Diagnosis not present

## 2016-05-10 DIAGNOSIS — M6281 Muscle weakness (generalized): Secondary | ICD-10-CM | POA: Diagnosis not present

## 2016-05-10 DIAGNOSIS — M2560 Stiffness of unspecified joint, not elsewhere classified: Secondary | ICD-10-CM | POA: Diagnosis not present

## 2016-05-10 DIAGNOSIS — G8929 Other chronic pain: Secondary | ICD-10-CM | POA: Diagnosis not present

## 2016-05-16 DIAGNOSIS — R293 Abnormal posture: Secondary | ICD-10-CM | POA: Diagnosis not present

## 2016-05-16 DIAGNOSIS — M5442 Lumbago with sciatica, left side: Secondary | ICD-10-CM | POA: Diagnosis not present

## 2016-05-16 DIAGNOSIS — G8929 Other chronic pain: Secondary | ICD-10-CM | POA: Diagnosis not present

## 2016-05-16 DIAGNOSIS — M6281 Muscle weakness (generalized): Secondary | ICD-10-CM | POA: Diagnosis not present

## 2016-05-16 DIAGNOSIS — R2689 Other abnormalities of gait and mobility: Secondary | ICD-10-CM | POA: Diagnosis not present

## 2016-05-16 DIAGNOSIS — M2560 Stiffness of unspecified joint, not elsewhere classified: Secondary | ICD-10-CM | POA: Diagnosis not present

## 2016-05-16 DIAGNOSIS — M25552 Pain in left hip: Secondary | ICD-10-CM | POA: Diagnosis not present

## 2016-05-22 DIAGNOSIS — R2689 Other abnormalities of gait and mobility: Secondary | ICD-10-CM | POA: Diagnosis not present

## 2016-05-22 DIAGNOSIS — M25552 Pain in left hip: Secondary | ICD-10-CM | POA: Diagnosis not present

## 2016-05-22 DIAGNOSIS — M5442 Lumbago with sciatica, left side: Secondary | ICD-10-CM | POA: Diagnosis not present

## 2016-05-22 DIAGNOSIS — G8929 Other chronic pain: Secondary | ICD-10-CM | POA: Diagnosis not present

## 2016-05-22 DIAGNOSIS — M6281 Muscle weakness (generalized): Secondary | ICD-10-CM | POA: Diagnosis not present

## 2016-05-22 DIAGNOSIS — M2560 Stiffness of unspecified joint, not elsewhere classified: Secondary | ICD-10-CM | POA: Diagnosis not present

## 2016-05-22 DIAGNOSIS — R293 Abnormal posture: Secondary | ICD-10-CM | POA: Diagnosis not present

## 2016-05-30 DIAGNOSIS — M25552 Pain in left hip: Secondary | ICD-10-CM | POA: Diagnosis not present

## 2016-05-30 DIAGNOSIS — M2560 Stiffness of unspecified joint, not elsewhere classified: Secondary | ICD-10-CM | POA: Diagnosis not present

## 2016-05-30 DIAGNOSIS — M6281 Muscle weakness (generalized): Secondary | ICD-10-CM | POA: Diagnosis not present

## 2016-05-30 DIAGNOSIS — G8929 Other chronic pain: Secondary | ICD-10-CM | POA: Diagnosis not present

## 2016-05-30 DIAGNOSIS — R2689 Other abnormalities of gait and mobility: Secondary | ICD-10-CM | POA: Diagnosis not present

## 2016-05-30 DIAGNOSIS — M5442 Lumbago with sciatica, left side: Secondary | ICD-10-CM | POA: Diagnosis not present

## 2016-05-30 DIAGNOSIS — R293 Abnormal posture: Secondary | ICD-10-CM | POA: Diagnosis not present

## 2016-06-04 DIAGNOSIS — M2560 Stiffness of unspecified joint, not elsewhere classified: Secondary | ICD-10-CM | POA: Diagnosis not present

## 2016-06-04 DIAGNOSIS — R293 Abnormal posture: Secondary | ICD-10-CM | POA: Diagnosis not present

## 2016-06-04 DIAGNOSIS — G8929 Other chronic pain: Secondary | ICD-10-CM | POA: Diagnosis not present

## 2016-06-04 DIAGNOSIS — M6281 Muscle weakness (generalized): Secondary | ICD-10-CM | POA: Diagnosis not present

## 2016-06-04 DIAGNOSIS — M25552 Pain in left hip: Secondary | ICD-10-CM | POA: Diagnosis not present

## 2016-06-04 DIAGNOSIS — R2689 Other abnormalities of gait and mobility: Secondary | ICD-10-CM | POA: Diagnosis not present

## 2016-06-04 DIAGNOSIS — M5442 Lumbago with sciatica, left side: Secondary | ICD-10-CM | POA: Diagnosis not present

## 2016-06-07 DIAGNOSIS — N3281 Overactive bladder: Secondary | ICD-10-CM | POA: Diagnosis not present

## 2016-06-13 DIAGNOSIS — M6281 Muscle weakness (generalized): Secondary | ICD-10-CM | POA: Diagnosis not present

## 2016-06-13 DIAGNOSIS — M2560 Stiffness of unspecified joint, not elsewhere classified: Secondary | ICD-10-CM | POA: Diagnosis not present

## 2016-06-13 DIAGNOSIS — M79605 Pain in left leg: Secondary | ICD-10-CM | POA: Diagnosis not present

## 2016-06-13 DIAGNOSIS — M25552 Pain in left hip: Secondary | ICD-10-CM | POA: Diagnosis not present

## 2016-06-13 DIAGNOSIS — G8929 Other chronic pain: Secondary | ICD-10-CM | POA: Diagnosis not present

## 2016-06-13 DIAGNOSIS — R293 Abnormal posture: Secondary | ICD-10-CM | POA: Diagnosis not present

## 2016-06-13 DIAGNOSIS — M5442 Lumbago with sciatica, left side: Secondary | ICD-10-CM | POA: Diagnosis not present

## 2016-06-13 DIAGNOSIS — R2689 Other abnormalities of gait and mobility: Secondary | ICD-10-CM | POA: Diagnosis not present

## 2016-06-17 DIAGNOSIS — E1129 Type 2 diabetes mellitus with other diabetic kidney complication: Secondary | ICD-10-CM | POA: Diagnosis not present

## 2016-06-17 DIAGNOSIS — N189 Chronic kidney disease, unspecified: Secondary | ICD-10-CM | POA: Diagnosis not present

## 2016-06-17 DIAGNOSIS — E785 Hyperlipidemia, unspecified: Secondary | ICD-10-CM | POA: Diagnosis not present

## 2016-06-19 DIAGNOSIS — Z23 Encounter for immunization: Secondary | ICD-10-CM | POA: Diagnosis not present

## 2016-06-19 DIAGNOSIS — N189 Chronic kidney disease, unspecified: Secondary | ICD-10-CM | POA: Diagnosis not present

## 2016-06-19 DIAGNOSIS — I130 Hypertensive heart and chronic kidney disease with heart failure and stage 1 through stage 4 chronic kidney disease, or unspecified chronic kidney disease: Secondary | ICD-10-CM | POA: Diagnosis not present

## 2016-06-19 DIAGNOSIS — E1129 Type 2 diabetes mellitus with other diabetic kidney complication: Secondary | ICD-10-CM | POA: Diagnosis not present

## 2016-06-19 DIAGNOSIS — Z0000 Encounter for general adult medical examination without abnormal findings: Secondary | ICD-10-CM | POA: Diagnosis not present

## 2016-06-21 DIAGNOSIS — R293 Abnormal posture: Secondary | ICD-10-CM | POA: Diagnosis not present

## 2016-06-21 DIAGNOSIS — M79605 Pain in left leg: Secondary | ICD-10-CM | POA: Diagnosis not present

## 2016-06-21 DIAGNOSIS — M2560 Stiffness of unspecified joint, not elsewhere classified: Secondary | ICD-10-CM | POA: Diagnosis not present

## 2016-06-21 DIAGNOSIS — M25552 Pain in left hip: Secondary | ICD-10-CM | POA: Diagnosis not present

## 2016-06-21 DIAGNOSIS — R2689 Other abnormalities of gait and mobility: Secondary | ICD-10-CM | POA: Diagnosis not present

## 2016-06-21 DIAGNOSIS — M6281 Muscle weakness (generalized): Secondary | ICD-10-CM | POA: Diagnosis not present

## 2016-06-21 DIAGNOSIS — M5442 Lumbago with sciatica, left side: Secondary | ICD-10-CM | POA: Diagnosis not present

## 2016-06-21 DIAGNOSIS — G8929 Other chronic pain: Secondary | ICD-10-CM | POA: Diagnosis not present

## 2016-06-28 DIAGNOSIS — M79605 Pain in left leg: Secondary | ICD-10-CM | POA: Diagnosis not present

## 2016-06-28 DIAGNOSIS — M6281 Muscle weakness (generalized): Secondary | ICD-10-CM | POA: Diagnosis not present

## 2016-06-28 DIAGNOSIS — G8929 Other chronic pain: Secondary | ICD-10-CM | POA: Diagnosis not present

## 2016-06-28 DIAGNOSIS — M5442 Lumbago with sciatica, left side: Secondary | ICD-10-CM | POA: Diagnosis not present

## 2016-06-28 DIAGNOSIS — R2689 Other abnormalities of gait and mobility: Secondary | ICD-10-CM | POA: Diagnosis not present

## 2016-06-28 DIAGNOSIS — M2560 Stiffness of unspecified joint, not elsewhere classified: Secondary | ICD-10-CM | POA: Diagnosis not present

## 2016-06-28 DIAGNOSIS — R293 Abnormal posture: Secondary | ICD-10-CM | POA: Diagnosis not present

## 2016-06-28 DIAGNOSIS — M25552 Pain in left hip: Secondary | ICD-10-CM | POA: Diagnosis not present

## 2016-07-03 DIAGNOSIS — R2689 Other abnormalities of gait and mobility: Secondary | ICD-10-CM | POA: Diagnosis not present

## 2016-07-03 DIAGNOSIS — G8929 Other chronic pain: Secondary | ICD-10-CM | POA: Diagnosis not present

## 2016-07-03 DIAGNOSIS — R293 Abnormal posture: Secondary | ICD-10-CM | POA: Diagnosis not present

## 2016-07-03 DIAGNOSIS — M5442 Lumbago with sciatica, left side: Secondary | ICD-10-CM | POA: Diagnosis not present

## 2016-07-03 DIAGNOSIS — M6281 Muscle weakness (generalized): Secondary | ICD-10-CM | POA: Diagnosis not present

## 2016-07-03 DIAGNOSIS — M2560 Stiffness of unspecified joint, not elsewhere classified: Secondary | ICD-10-CM | POA: Diagnosis not present

## 2016-07-03 DIAGNOSIS — M25552 Pain in left hip: Secondary | ICD-10-CM | POA: Diagnosis not present

## 2016-07-03 DIAGNOSIS — M79605 Pain in left leg: Secondary | ICD-10-CM | POA: Diagnosis not present

## 2016-07-11 DIAGNOSIS — M25552 Pain in left hip: Secondary | ICD-10-CM | POA: Diagnosis not present

## 2016-07-11 DIAGNOSIS — G8929 Other chronic pain: Secondary | ICD-10-CM | POA: Diagnosis not present

## 2016-07-11 DIAGNOSIS — M5442 Lumbago with sciatica, left side: Secondary | ICD-10-CM | POA: Diagnosis not present

## 2016-07-17 DIAGNOSIS — M25552 Pain in left hip: Secondary | ICD-10-CM | POA: Diagnosis not present

## 2016-07-17 DIAGNOSIS — G8929 Other chronic pain: Secondary | ICD-10-CM | POA: Diagnosis not present

## 2016-07-17 DIAGNOSIS — M5442 Lumbago with sciatica, left side: Secondary | ICD-10-CM | POA: Diagnosis not present

## 2016-08-01 DIAGNOSIS — G8929 Other chronic pain: Secondary | ICD-10-CM | POA: Diagnosis not present

## 2016-08-01 DIAGNOSIS — M25552 Pain in left hip: Secondary | ICD-10-CM | POA: Diagnosis not present

## 2016-08-01 DIAGNOSIS — M5442 Lumbago with sciatica, left side: Secondary | ICD-10-CM | POA: Diagnosis not present

## 2016-08-15 DIAGNOSIS — R2689 Other abnormalities of gait and mobility: Secondary | ICD-10-CM | POA: Diagnosis not present

## 2016-08-15 DIAGNOSIS — M25552 Pain in left hip: Secondary | ICD-10-CM | POA: Diagnosis not present

## 2016-08-15 DIAGNOSIS — M6281 Muscle weakness (generalized): Secondary | ICD-10-CM | POA: Diagnosis not present

## 2016-08-15 DIAGNOSIS — M2560 Stiffness of unspecified joint, not elsewhere classified: Secondary | ICD-10-CM | POA: Diagnosis not present

## 2016-08-15 DIAGNOSIS — M5442 Lumbago with sciatica, left side: Secondary | ICD-10-CM | POA: Diagnosis not present

## 2016-08-15 DIAGNOSIS — R293 Abnormal posture: Secondary | ICD-10-CM | POA: Diagnosis not present

## 2016-08-27 DIAGNOSIS — M6281 Muscle weakness (generalized): Secondary | ICD-10-CM | POA: Diagnosis not present

## 2016-08-27 DIAGNOSIS — M25552 Pain in left hip: Secondary | ICD-10-CM | POA: Diagnosis not present

## 2016-08-27 DIAGNOSIS — R293 Abnormal posture: Secondary | ICD-10-CM | POA: Diagnosis not present

## 2016-08-27 DIAGNOSIS — R2689 Other abnormalities of gait and mobility: Secondary | ICD-10-CM | POA: Diagnosis not present

## 2016-08-27 DIAGNOSIS — M2560 Stiffness of unspecified joint, not elsewhere classified: Secondary | ICD-10-CM | POA: Diagnosis not present

## 2016-08-27 DIAGNOSIS — M5442 Lumbago with sciatica, left side: Secondary | ICD-10-CM | POA: Diagnosis not present

## 2016-09-06 DIAGNOSIS — S0181XA Laceration without foreign body of other part of head, initial encounter: Secondary | ICD-10-CM | POA: Diagnosis not present

## 2016-09-06 DIAGNOSIS — Z79899 Other long term (current) drug therapy: Secondary | ICD-10-CM | POA: Diagnosis not present

## 2016-09-06 DIAGNOSIS — S0101XA Laceration without foreign body of scalp, initial encounter: Secondary | ICD-10-CM | POA: Diagnosis not present

## 2016-09-06 DIAGNOSIS — I110 Hypertensive heart disease with heart failure: Secondary | ICD-10-CM | POA: Diagnosis not present

## 2016-09-06 DIAGNOSIS — S098XXA Other specified injuries of head, initial encounter: Secondary | ICD-10-CM | POA: Diagnosis not present

## 2016-09-06 DIAGNOSIS — S0191XA Laceration without foreign body of unspecified part of head, initial encounter: Secondary | ICD-10-CM | POA: Diagnosis not present

## 2016-09-06 DIAGNOSIS — B379 Candidiasis, unspecified: Secondary | ICD-10-CM | POA: Diagnosis not present

## 2016-09-06 DIAGNOSIS — Z7982 Long term (current) use of aspirin: Secondary | ICD-10-CM | POA: Diagnosis not present

## 2016-09-06 DIAGNOSIS — M542 Cervicalgia: Secondary | ICD-10-CM | POA: Diagnosis not present

## 2016-09-06 DIAGNOSIS — Z794 Long term (current) use of insulin: Secondary | ICD-10-CM | POA: Diagnosis not present

## 2016-09-06 DIAGNOSIS — S299XXA Unspecified injury of thorax, initial encounter: Secondary | ICD-10-CM | POA: Diagnosis not present

## 2016-09-06 DIAGNOSIS — E119 Type 2 diabetes mellitus without complications: Secondary | ICD-10-CM | POA: Diagnosis not present

## 2016-09-06 DIAGNOSIS — I509 Heart failure, unspecified: Secondary | ICD-10-CM | POA: Diagnosis not present

## 2016-09-06 DIAGNOSIS — R51 Headache: Secondary | ICD-10-CM | POA: Diagnosis not present

## 2016-09-06 DIAGNOSIS — Z951 Presence of aortocoronary bypass graft: Secondary | ICD-10-CM | POA: Diagnosis not present

## 2016-09-06 DIAGNOSIS — S199XXA Unspecified injury of neck, initial encounter: Secondary | ICD-10-CM | POA: Diagnosis not present

## 2016-09-06 DIAGNOSIS — I2510 Atherosclerotic heart disease of native coronary artery without angina pectoris: Secondary | ICD-10-CM | POA: Diagnosis not present

## 2016-09-06 DIAGNOSIS — I252 Old myocardial infarction: Secondary | ICD-10-CM | POA: Diagnosis not present

## 2016-09-18 DIAGNOSIS — Z4802 Encounter for removal of sutures: Secondary | ICD-10-CM | POA: Diagnosis not present

## 2016-10-12 DIAGNOSIS — E785 Hyperlipidemia, unspecified: Secondary | ICD-10-CM | POA: Diagnosis not present

## 2016-10-12 DIAGNOSIS — I130 Hypertensive heart and chronic kidney disease with heart failure and stage 1 through stage 4 chronic kidney disease, or unspecified chronic kidney disease: Secondary | ICD-10-CM | POA: Diagnosis not present

## 2016-10-12 DIAGNOSIS — E1129 Type 2 diabetes mellitus with other diabetic kidney complication: Secondary | ICD-10-CM | POA: Diagnosis not present

## 2016-10-12 DIAGNOSIS — I2510 Atherosclerotic heart disease of native coronary artery without angina pectoris: Secondary | ICD-10-CM | POA: Diagnosis not present

## 2016-10-12 DIAGNOSIS — N189 Chronic kidney disease, unspecified: Secondary | ICD-10-CM | POA: Diagnosis not present

## 2016-12-16 DIAGNOSIS — Z45018 Encounter for adjustment and management of other part of cardiac pacemaker: Secondary | ICD-10-CM | POA: Diagnosis not present

## 2016-12-16 DIAGNOSIS — I10 Essential (primary) hypertension: Secondary | ICD-10-CM | POA: Diagnosis not present

## 2016-12-16 DIAGNOSIS — E785 Hyperlipidemia, unspecified: Secondary | ICD-10-CM | POA: Diagnosis not present

## 2016-12-16 DIAGNOSIS — I5032 Chronic diastolic (congestive) heart failure: Secondary | ICD-10-CM | POA: Diagnosis not present

## 2016-12-16 DIAGNOSIS — I25118 Atherosclerotic heart disease of native coronary artery with other forms of angina pectoris: Secondary | ICD-10-CM | POA: Diagnosis not present

## 2016-12-20 DIAGNOSIS — E785 Hyperlipidemia, unspecified: Secondary | ICD-10-CM | POA: Diagnosis not present

## 2016-12-20 DIAGNOSIS — I5032 Chronic diastolic (congestive) heart failure: Secondary | ICD-10-CM | POA: Diagnosis not present

## 2016-12-20 DIAGNOSIS — I10 Essential (primary) hypertension: Secondary | ICD-10-CM | POA: Diagnosis not present

## 2016-12-20 DIAGNOSIS — Z45018 Encounter for adjustment and management of other part of cardiac pacemaker: Secondary | ICD-10-CM | POA: Diagnosis not present

## 2016-12-20 DIAGNOSIS — I25118 Atherosclerotic heart disease of native coronary artery with other forms of angina pectoris: Secondary | ICD-10-CM | POA: Diagnosis not present

## 2017-01-10 DIAGNOSIS — M4696 Unspecified inflammatory spondylopathy, lumbar region: Secondary | ICD-10-CM | POA: Diagnosis not present

## 2017-01-10 DIAGNOSIS — M48061 Spinal stenosis, lumbar region without neurogenic claudication: Secondary | ICD-10-CM | POA: Diagnosis not present

## 2017-01-10 DIAGNOSIS — M961 Postlaminectomy syndrome, not elsewhere classified: Secondary | ICD-10-CM | POA: Diagnosis not present

## 2017-01-10 DIAGNOSIS — M5136 Other intervertebral disc degeneration, lumbar region: Secondary | ICD-10-CM | POA: Diagnosis not present

## 2017-01-10 DIAGNOSIS — G894 Chronic pain syndrome: Secondary | ICD-10-CM | POA: Diagnosis not present

## 2017-01-18 DIAGNOSIS — E1129 Type 2 diabetes mellitus with other diabetic kidney complication: Secondary | ICD-10-CM | POA: Diagnosis not present

## 2017-01-18 DIAGNOSIS — E785 Hyperlipidemia, unspecified: Secondary | ICD-10-CM | POA: Diagnosis not present

## 2017-01-18 DIAGNOSIS — Z139 Encounter for screening, unspecified: Secondary | ICD-10-CM | POA: Diagnosis not present

## 2017-01-18 DIAGNOSIS — I130 Hypertensive heart and chronic kidney disease with heart failure and stage 1 through stage 4 chronic kidney disease, or unspecified chronic kidney disease: Secondary | ICD-10-CM | POA: Diagnosis not present

## 2017-01-18 DIAGNOSIS — N189 Chronic kidney disease, unspecified: Secondary | ICD-10-CM | POA: Diagnosis not present

## 2017-01-21 DIAGNOSIS — E1129 Type 2 diabetes mellitus with other diabetic kidney complication: Secondary | ICD-10-CM | POA: Diagnosis not present

## 2017-02-06 DIAGNOSIS — Z955 Presence of coronary angioplasty implant and graft: Secondary | ICD-10-CM | POA: Diagnosis not present

## 2017-02-06 DIAGNOSIS — Z87891 Personal history of nicotine dependence: Secondary | ICD-10-CM | POA: Diagnosis not present

## 2017-02-06 DIAGNOSIS — I252 Old myocardial infarction: Secondary | ICD-10-CM | POA: Diagnosis not present

## 2017-02-06 DIAGNOSIS — I10 Essential (primary) hypertension: Secondary | ICD-10-CM | POA: Diagnosis not present

## 2017-02-06 DIAGNOSIS — M545 Low back pain: Secondary | ICD-10-CM | POA: Diagnosis not present

## 2017-02-06 DIAGNOSIS — M48061 Spinal stenosis, lumbar region without neurogenic claudication: Secondary | ICD-10-CM | POA: Diagnosis not present

## 2017-02-06 DIAGNOSIS — Z79899 Other long term (current) drug therapy: Secondary | ICD-10-CM | POA: Diagnosis not present

## 2017-02-06 DIAGNOSIS — Z794 Long term (current) use of insulin: Secondary | ICD-10-CM | POA: Diagnosis not present

## 2017-02-06 DIAGNOSIS — Z9889 Other specified postprocedural states: Secondary | ICD-10-CM | POA: Diagnosis not present

## 2017-02-06 DIAGNOSIS — M5416 Radiculopathy, lumbar region: Secondary | ICD-10-CM

## 2017-02-06 DIAGNOSIS — Z7982 Long term (current) use of aspirin: Secondary | ICD-10-CM | POA: Diagnosis not present

## 2017-02-06 DIAGNOSIS — E119 Type 2 diabetes mellitus without complications: Secondary | ICD-10-CM | POA: Diagnosis not present

## 2017-02-06 DIAGNOSIS — M4316 Spondylolisthesis, lumbar region: Secondary | ICD-10-CM | POA: Diagnosis not present

## 2017-02-06 HISTORY — DX: Radiculopathy, lumbar region: M54.16

## 2017-02-19 DIAGNOSIS — M5126 Other intervertebral disc displacement, lumbar region: Secondary | ICD-10-CM | POA: Diagnosis not present

## 2017-02-19 DIAGNOSIS — M5416 Radiculopathy, lumbar region: Secondary | ICD-10-CM | POA: Diagnosis not present

## 2017-02-19 DIAGNOSIS — M545 Low back pain: Secondary | ICD-10-CM | POA: Diagnosis not present

## 2017-03-06 DIAGNOSIS — M4316 Spondylolisthesis, lumbar region: Secondary | ICD-10-CM

## 2017-03-06 DIAGNOSIS — M4180 Other forms of scoliosis, site unspecified: Secondary | ICD-10-CM

## 2017-03-06 DIAGNOSIS — M5416 Radiculopathy, lumbar region: Secondary | ICD-10-CM | POA: Diagnosis not present

## 2017-03-06 DIAGNOSIS — M4150 Other secondary scoliosis, site unspecified: Secondary | ICD-10-CM

## 2017-03-06 HISTORY — DX: Spondylolisthesis, lumbar region: M43.16

## 2017-03-06 HISTORY — DX: Other secondary scoliosis, site unspecified: M41.50

## 2017-03-06 HISTORY — DX: Other forms of scoliosis, site unspecified: M41.80

## 2017-03-07 DIAGNOSIS — I6529 Occlusion and stenosis of unspecified carotid artery: Secondary | ICD-10-CM

## 2017-03-07 DIAGNOSIS — I480 Paroxysmal atrial fibrillation: Secondary | ICD-10-CM

## 2017-03-07 DIAGNOSIS — I5032 Chronic diastolic (congestive) heart failure: Secondary | ICD-10-CM

## 2017-03-07 DIAGNOSIS — I2510 Atherosclerotic heart disease of native coronary artery without angina pectoris: Secondary | ICD-10-CM

## 2017-03-07 HISTORY — DX: Chronic diastolic (congestive) heart failure: I50.32

## 2017-03-07 HISTORY — DX: Atherosclerotic heart disease of native coronary artery without angina pectoris: I25.10

## 2017-03-07 HISTORY — DX: Paroxysmal atrial fibrillation: I48.0

## 2017-03-07 HISTORY — DX: Occlusion and stenosis of unspecified carotid artery: I65.29

## 2017-03-12 ENCOUNTER — Ambulatory Visit (INDEPENDENT_AMBULATORY_CARE_PROVIDER_SITE_OTHER): Payer: Medicare Other | Admitting: Cardiology

## 2017-03-12 ENCOUNTER — Encounter: Payer: Self-pay | Admitting: Cardiology

## 2017-03-12 ENCOUNTER — Telehealth: Payer: Self-pay | Admitting: Cardiology

## 2017-03-12 VITALS — BP 128/70 | HR 73 | Ht 67.0 in | Wt 193.0 lb

## 2017-03-12 DIAGNOSIS — Z01810 Encounter for preprocedural cardiovascular examination: Secondary | ICD-10-CM | POA: Diagnosis not present

## 2017-03-12 DIAGNOSIS — I5032 Chronic diastolic (congestive) heart failure: Secondary | ICD-10-CM | POA: Diagnosis not present

## 2017-03-12 DIAGNOSIS — Z950 Presence of cardiac pacemaker: Secondary | ICD-10-CM

## 2017-03-12 DIAGNOSIS — I25119 Atherosclerotic heart disease of native coronary artery with unspecified angina pectoris: Secondary | ICD-10-CM

## 2017-03-12 NOTE — Patient Instructions (Addendum)
Medication Instructions:  Your physician recommends that you continue on your current medications as directed. Please refer to the Current Medication list given to you today.   Labwork: None  Testing/Procedures: You had an EKG today.  Follow-Up: Your physician wants you to follow-up in: 6 months. You will receive a reminder letter in the mail two months in advance. If you don't receive a letter, please call our office to schedule the follow-up appointment.  You will receive a phone call to schedule with Dr. Camitz-electrophysiology.   Any Other Special Instructions Will Be Listed Below (If Applicable).     If you need a refill on your cardiac medications before your next appointment, please call your pharmacy.

## 2017-03-12 NOTE — Progress Notes (Signed)
Cardiology Office Note:    Date:  03/12/2017   ID:  Travis [name], DOB Feb 08, 1931, MRN [id number]  PCP:  Travis Fusi, MD  Cardiologist:  Travis Herrlich, MD    Referring MD: Travis Fusi, MD    ASSESSMENT:    1. Preoperative cardiovascular examination   2. Coronary artery disease involving native coronary artery of native heart with angina pectoris (HCC)   3. Chronic diastolic heart failure (HCC)   4. Pacemaker    PLAN:    In order of problems listed above: Preoperative cardiovascular evaluation  Surgeon: Travis Body MD (Attending) [phone] (Work) [phone] [facility]) [address] [place], [place] [address] Procedure: Spine surgery  The surgery is elective Active cardiac problems  CAD heart failure pacemaker. The cardiac status is stable clinically he is at his optimal at this point in time . The planned procedure is intermediate risk. The cardiac risk factors are CAD heart failure and pacemaker  The functional capacity is 4 mets or greater  no venous severely limited although his family said that recently he walked the length of the grocery store and back without stopping  Recent cardiac tests performed pacemaker evaluated and reprogrammed office  Given the above his overall risk for the planned procedure is acceptable in the opinion of the patient and his family. He is not a candidate for further cardiac revascularization and I do not see the need for any further preoperative cardiac evaluation.  Antiplatelet/ anticoagulant recommendation: None  Other cardiac medication or device recommendation: None  Anesthesia recommendation: None  Observation, monitoring,and postoperative test recommendation: Placed on monitored bed for the first 24 hours postoperatively. Intraoperatively a smaller magnitude would be appropriate with pacemaker dependency The patient is optimized from a cardiology perspective: Yes   CAD, stable CHF stable compensated no volume  overload at this time does not require diuretic Pacemaker stable reprogrammed in my office   Next appointment: 6 months   Medication Adjustments/Labs and Tests Ordered: Current medicines are reviewed at length with the patient today.  Concerns regarding medicines are outlined above.  No orders of the defined types were placed in this encounter.  No orders of the defined types were placed in this encounter.   Chief Complaint  Patient presents with  . Pre-op Exam    Preop prior to back surgery per Dr Travis [name] in [facility]    History of Present Illness:    Travis [name] is a 83 y.o. male with a hx of CAD , Diastolic CHF, S/P CABG in 2012, PAF and pacemaker  last seen three months ago.  He is suffering from severe back pain his quality of life is poor and the patient and family made a decision to proceed with surgery prior to seeing office. He's had no recurrent chest pain although he is very sedentary. He has no shortness of breath and has not required diuretic and has no edema. He's had no palpitation or syncope. Pacemaker is interrogated program stable function he is pacemaker dependent Family is aware that he is a high risk patient and accept the risk with the hope that his pain could be alleviators he can remain ambulatory and could be cared for at home.  From note of Dr Travis [name] 03/06/17:Assessment:  1. Lumbar radiculopathy  2. Spondylolisthesis at L3-L4 level  3. Scoliosis due to degenerative disease of spine in adult patient Plan:  I offered him the option of an L2-3 and L3-4 oblique lumbar interbody fusion with plating as well as the  posterior pedicle screw and rod fixation spanning L2 through L4. We discussed the significant risks associated with any surgery at his advanced age and deconditioning. The main goal of surgery would be to prevent progression of his back pain, degenerative scoliosis and lumbar radiculopathy in order to prevent the need for placement in a nursing home.  The additional goals of surgery would also be to alleviate at least 50% of his back and leg pain. He voiced understanding of this plan as did his son during their clinic appointment. They wish to go home and discuss the surgical option and associated risk/benefits more with their entire family. I advised them to call if they desire to proceed with surgery.      . Compliance with diet, lifestyle and medications: No misses medications Past Medical History:  Diagnosis Date  . Arthritis    arthritis of spine, DDD  . Back pain    'spinal stenosis", s/p herniation disc surgery  . Benign essential hypertension 09/04/2015  . Carotid stenosis 03/07/2017  . Chest pain 09/04/2015  . CHF (congestive heart failure) (HCC)   . Chronic bilateral low back pain with left-sided sciatica 04/18/2016  . Chronic coronary artery disease 09/04/2015  . Chronic diastolic heart failure (HCC) 03/07/2017  . Chronic kidney disease    some kidney filtering issues-now showing improvement  . Chronic left hip pain 04/18/2016  . Complete atrioventricular block (HCC) 07/27/2015  . Coronary arteriosclerosis in native artery 09/04/2015  . Coronary artery disease   . Coronary disorder 03/07/2017  . Diabetes mellitus without complication (HCC)   . Diverticulosis    past history Diverticulitis  . Elevated cholesterol   . Encounter for adjustment and management of other part of cardiac pacemaker 07/27/2015  . Fracture of femur, trochanteric (HCC)    left  . GERD (gastroesophageal reflux disease)   . Heart block 09/04/2015  . Hypertension   . Lumbar radiculopathy 02/06/2017  . Lumbar spinal stenosis 12/07/2014  . Mitral valve disease 09/04/2015  . Myocardial infarction (HCC)   . Neuromuscular disorder (HCC)    neuropathy legs/feet  . Pacemaker reprogramming/check 07/27/2015  . Paroxysmal atrial fibrillation (HCC) 03/07/2017  . Presence of permanent cardiac pacemaker    [place] - 05-10-13 Medtronic" bradycardia" RBBB  . S/P lumbar  laminectomy 04/18/2016  . Scoliosis due to degenerative disease of spine in adult patient 03/06/2017  . Severe sinus bradycardia 09/04/2015  . Sleep apnea    no cpap used, couldn't tolerate  . Spinal stenosis of lumbar region 12/07/2014  . Spondylolisthesis at L3-L4 level 03/06/2017  . Stenosis of carotid artery 09/04/2015  . Stroke (HCC)   . Transient complete heart block (HCC) 07/27/2015    Past Surgical History:  Procedure Laterality Date  . AORTIC VALVE REPLACEMENT     7'13  . APPENDECTOMY    . BACK SURGERY     x1 lumbar  . COLON SURGERY     colon resection due to inflammation "diverticulitis"  . CORONARY ANGIOPLASTY     x 1 stent  . CORONARY ARTERY BYPASS GRAFT     x1 - 7'13  in [place]  . HERNIA REPAIR    . LUMBAR LAMINECTOMY/DECOMPRESSION MICRODISCECTOMY N/A 12/07/2014   Procedure: DECOMPRESSION L3-L4 L2-L3,discectomy L3,4  ;  Surgeon: Jene Every, MD;  Location: [facility];  Service: Orthopedics;  Laterality: N/A;    Current Medications: Current Meds  Medication Sig  . albuterol (PROVENTIL HFA;VENTOLIN HFA) 108 (90 BASE) MCG/ACT inhaler Inhale 2 puffs into the lungs  every 6 (six) hours as needed for wheezing or shortness of breath.  Marland Kitchen. aspirin EC 81 MG tablet Take 1 tablet (81 mg total) by mouth daily. Resume 4 days post-op  . atorvastatin (LIPITOR) 80 MG tablet Take 80 mg by mouth at bedtime.  . diazepam (VALIUM) 10 MG tablet Take 10 mg by mouth every 6 (six) hours as needed for anxiety.  . docusate sodium (COLACE) 100 MG capsule Take 1 capsule (100 mg total) by mouth 2 (two) times daily as needed for mild constipation.  Marland Kitchen. escitalopram (LEXAPRO) 20 MG tablet Take 20 mg by mouth daily.  . furosemide (LASIX) 40 MG tablet Take 40 mg by mouth daily as needed for fluid.  Marland Kitchen. HYDROcodone-acetaminophen (NORCO/VICODIN) 5-325 MG per tablet Take 1 tablet by mouth every 4 (four) hours as needed.  . insulin lispro (HUMALOG) 100 UNIT/ML injection Inject 0.2-0.25 mLs (20-25 Units total) into  the skin 2 (two) times daily. Inject 25 units in Am, and 20 units in the pm (Patient taking differently: Inject 60 Units into the skin daily. )     Allergies:   Patient has no known allergies.   Social History   Social History  . Marital status: Married    Spouse name: N/A  . Number of children: N/A  . Years of education: N/A   Social History Main Topics  . Smoking status: Former Smoker    Types: Cigarettes    Quit date: 11/28/1988  . Smokeless tobacco: Never Used  . Alcohol use No     Comment: Quit 18 yrs ago  . Drug use: No  . Sexual activity: Not Asked   Other Topics Concern  . None   Social History Narrative  . None     Family History: The patient's family history is not on file. ROS:   Please see the history of present illness.    All other systems reviewed and are negative.  EKGs/Labs/Other Studies Reviewed:    The following studies were reviewed today:  EKG:  EKG ordered today.  The ekg ordered today demonstrates Dual-chamber paced rhythm his pacemaker Medtronic was interrogated in the office today with good parameters and reprogrammed to a more physiologic AV interval. He is pacemaker dependent with underlying AV dissociated rhythm pacemaker function is stable.  Recent Labs: Requested from his PCP No results found for requested labs within last 8760 hours.  Recent Lipid Panel No results found for: CHOL, TRIG, HDL, CHOLHDL, VLDL, LDLCALC, LDLDIRECT  Physical Exam:    VS:  BP 128/70 (BP Location: Left Arm, Patient Position: Sitting)   Pulse 73   Ht 5\' 7"  (1.702 m)   Wt 193 lb (87.5 kg)   SpO2 94%   BMI 30.23 kg/m     Wt Readings from Last 3 Encounters:  03/12/17 193 lb (87.5 kg)  12/10/14 206 lb 2.1 oz (93.5 kg)  11/29/14 201 lb 6 oz (91.3 kg)     GEN:  Very frail chronically ill-appearing elderly man in no acute distress HEENT: Normal NECK: No JVD; No carotid bruits LYMPHATICS: No lymphadenopathy CARDIAC: RRR, no murmurs, rubs,  gallops RESPIRATORY:  Clear to auscultation without rales, wheezing or rhonchi  ABDOMEN: Soft, non-tender, non-distended MUSCULOSKELETAL:  No edema; No deformity  SKIN: Warm and dry NEUROLOGIC:  Alert and oriented x 3 PSYCHIATRIC:  Normal affect    Signed, Travis HerrlichBrian Antion Andres, MD  03/12/2017 1:39 PM    [facility]

## 2017-03-12 NOTE — Telephone Encounter (Signed)
New Message  Pt son call requesting to speak with RN. Pt son would like to know with pt surgery in September would it be best to make appt with Dr. Dulce SellarMunley closer to surgery date. Please call back to discuss

## 2017-03-12 NOTE — Telephone Encounter (Signed)
Advised to keep appointment scheduled for this afternoon in case there is any testing that needs to be done, that way there is enough time to perform testing prior to surgery. Son verbalized understanding.

## 2017-04-08 ENCOUNTER — Encounter: Payer: Medicare Other | Admitting: Cardiology

## 2017-04-09 ENCOUNTER — Encounter: Payer: Self-pay | Admitting: Cardiology

## 2017-04-09 ENCOUNTER — Ambulatory Visit (INDEPENDENT_AMBULATORY_CARE_PROVIDER_SITE_OTHER): Payer: Medicare Other | Admitting: Cardiology

## 2017-04-09 VITALS — BP 134/78 | HR 75 | Ht 67.0 in | Wt 192.4 lb

## 2017-04-09 DIAGNOSIS — I25708 Atherosclerosis of coronary artery bypass graft(s), unspecified, with other forms of angina pectoris: Secondary | ICD-10-CM

## 2017-04-09 DIAGNOSIS — I10 Essential (primary) hypertension: Secondary | ICD-10-CM | POA: Diagnosis not present

## 2017-04-09 DIAGNOSIS — I495 Sick sinus syndrome: Secondary | ICD-10-CM | POA: Diagnosis not present

## 2017-04-09 DIAGNOSIS — I480 Paroxysmal atrial fibrillation: Secondary | ICD-10-CM | POA: Diagnosis not present

## 2017-04-09 DIAGNOSIS — I5032 Chronic diastolic (congestive) heart failure: Secondary | ICD-10-CM

## 2017-04-09 NOTE — Progress Notes (Signed)
Electrophysiology Office Note   Date:  04/09/2017   ID:  Travis [name], DOB 05-19-1931, MRN [id number]  PCP:  Paulina Fusi, MD  Cardiologist:  Dulce Sellar Primary Electrophysiologist:  Camika Marsico Travis Loa, MD    Chief Complaint  Patient presents with  . Defib Check     History of Present Illness: Travis [name] is a 83 y.o. male who is being seen today for the evaluation of pacemaker at the request of Paulina Fusi, MD. Presenting today for electrophysiology evaluation. He has a history of coronary artery disease, chronic diastolic heart failure, sleep apnea not on CPAP, paroxysmal atrial fibrillation and a pacemaker in place.    Today, he denies symptoms of palpitations, chest pain, shortness of breath, orthopnea, PND, lower extremity edema, claudication, dizziness, presyncope, syncope, bleeding, or neurologic sequela. The patient is tolerating medications without difficulties.    Past Medical History:  Diagnosis Date  . Arthritis    arthritis of spine, DDD  . Back pain    'spinal stenosis", s/p herniation disc surgery  . Benign essential hypertension 09/04/2015  . Carotid stenosis 03/07/2017  . Chest pain 09/04/2015  . CHF (congestive heart failure) (HCC)   . Chronic bilateral low back pain with left-sided sciatica 04/18/2016  . Chronic coronary artery disease 09/04/2015  . Chronic diastolic heart failure (HCC) 03/07/2017  . Chronic kidney disease    some kidney filtering issues-now showing improvement  . Chronic left hip pain 04/18/2016  . Complete atrioventricular block (HCC) 07/27/2015  . Coronary arteriosclerosis in native artery 09/04/2015  . Coronary artery disease   . Coronary disorder 03/07/2017  . Diabetes mellitus without complication (HCC)   . Diverticulosis    past history Diverticulitis  . Elevated cholesterol   . Encounter for adjustment and management of other part of cardiac pacemaker 07/27/2015  . Fracture of femur, trochanteric (HCC)    left  . GERD  (gastroesophageal reflux disease)   . Heart block 09/04/2015  . Hypertension   . Lumbar radiculopathy 02/06/2017  . Lumbar spinal stenosis 12/07/2014  . Mitral valve disease 09/04/2015  . Myocardial infarction (HCC)   . Neuromuscular disorder (HCC)    neuropathy legs/feet  . Pacemaker reprogramming/check 07/27/2015  . Paroxysmal atrial fibrillation (HCC) 03/07/2017  . Presence of permanent cardiac pacemaker    [place] - 05-10-13 Medtronic" bradycardia" RBBB  . S/P lumbar laminectomy 04/18/2016  . Scoliosis due to degenerative disease of spine in adult patient 03/06/2017  . Severe sinus bradycardia 09/04/2015  . Sleep apnea    no cpap used, couldn't tolerate  . Spinal stenosis of lumbar region 12/07/2014  . Spondylolisthesis at L3-L4 level 03/06/2017  . Stenosis of carotid artery 09/04/2015  . Stroke (HCC)   . Transient complete heart block (HCC) 07/27/2015   Past Surgical History:  Procedure Laterality Date  . AORTIC VALVE REPLACEMENT     7'13  . APPENDECTOMY    . BACK SURGERY     x1 lumbar  . COLON SURGERY     colon resection due to inflammation "diverticulitis"  . CORONARY ANGIOPLASTY     x 1 stent  . CORONARY ARTERY BYPASS GRAFT     x1 - 7'13  in [place]  . HERNIA REPAIR    . LUMBAR LAMINECTOMY/DECOMPRESSION MICRODISCECTOMY N/A 12/07/2014   Procedure: DECOMPRESSION L3-L4 L2-L3,discectomy L3,4  ;  Surgeon: Jene Every, MD;  Location: [facility];  Service: Orthopedics;  Laterality: N/A;     Current Outpatient Prescriptions  Medication Sig Dispense Refill  .  albuterol (PROVENTIL HFA;VENTOLIN HFA) 108 (90 BASE) MCG/ACT inhaler Inhale 2 puffs into the lungs every 6 (six) hours as needed for wheezing or shortness of breath.    Marland Kitchen aspirin EC 81 MG tablet Take 1 tablet (81 mg total) by mouth daily. Resume 4 days post-op    . atorvastatin (LIPITOR) 80 MG tablet Take 80 mg by mouth at bedtime.    . diazepam (VALIUM) 10 MG tablet Take 10 mg by mouth every 6 (six) hours as needed for anxiety.     . docusate sodium (COLACE) 100 MG capsule Take 1 capsule (100 mg total) by mouth 2 (two) times daily as needed for mild constipation. 20 capsule 1  . escitalopram (LEXAPRO) 20 MG tablet Take 20 mg by mouth daily.    . furosemide (LASIX) 40 MG tablet Take 40 mg by mouth daily as needed for fluid.    Marland Kitchen HYDROcodone-acetaminophen (NORCO/VICODIN) 5-325 MG per tablet Take 1 tablet by mouth every 4 (four) hours as needed. 60 tablet 0  . insulin lispro (HUMALOG) 100 UNIT/ML injection Inject 60 Units into the skin daily.     No current facility-administered medications for this visit.     Allergies:   Patient has no known allergies.   Social History:  The patient  reports that he quit smoking about 28 years ago. His smoking use included Cigarettes. He has never used smokeless tobacco. He reports that he does not drink alcohol or use drugs.   Family History:  The patient's family history includes Cancer in his brother and mother; Stroke in his brother; Tuberculosis in his father.    ROS:  Please see the history of present illness.   Otherwise, review of systems is positive for SOB.   All other systems are reviewed and negative.    PHYSICAL EXAM: VS:  BP 134/78   Pulse 75   Ht 5\' 7"  (1.702 m)   Wt 192 lb 6.4 oz (87.3 kg)   SpO2 98%   BMI 30.13 kg/m  , BMI Body mass index is 30.13 kg/m. GEN: Well nourished, well developed, in no acute distress  HEENT: normal  Neck: no JVD, carotid bruits, or masses Cardiac: RRR; no murmurs, rubs, or gallops,no edema  Respiratory:  clear to auscultation bilaterally, normal work of breathing GI: soft, nontender, nondistended, + BS MS: no deformity or atrophy  Skin: warm and dry, device pocket is well healed Neuro:  Strength and sensation are intact Psych: euthymic mood, full affect  EKG:  EKG is not ordered today. Personal review of the ekg ordered 03/12/17 shows AV paced  Device interrogation is reviewed today in detail.  See PaceArt for  details.   Recent Labs: No results found for requested labs within last 8760 hours.    Lipid Panel  No results found for: CHOL, TRIG, HDL, CHOLHDL, VLDL, LDLCALC, LDLDIRECT   Wt Readings from Last 3 Encounters:  04/09/17 192 lb 6.4 oz (87.3 kg)  03/12/17 193 lb (87.5 kg)  12/10/14 206 lb 2.1 oz (93.5 kg)      Other studies Reviewed: Additional studies/ records that were reviewed today include: TTE 09/21/15  Review of the above records today demonstrates:  The left ventricular size is normal. There is mild concentric left ventricular hypertrophy. The left ventricle is hyperdynamic. LV ejection fraction = 65-70%. Left ventricular filling pattern is pseudonormal. The right ventricle is normal in size and function. Device lead in the right ventricle There is mild aortic stenosis. There is moderate mitral annular calcification.  Estimated right atrial pressure is 10 mmHg.Marland Kitchen. There is no pericardial effusion.   ASSESSMENT AND PLAN:  1.  Sinus node dysfunction: Status post Medtronic dual chamber pacemaker. He is pacer dependent. No changes at this time.  2. Paroxysmal atrial fibrillation: Currently not anticoagulated due to high fall risk.  This patients CHA2DS2-VASc Score and unadjusted Ischemic Stroke Rate (% per year) is equal to 4.8 % stroke rate/year from a score of 4  Above score calculated as 1 point each if present [CHF, HTN, DM, Vascular=MI/PAD/Aortic Plaque, Age if 65-74, or Male] Above score calculated as 2 points each if present [Age > 75, or Stroke/TIA/TE]  3. Coronary artery disease status post CABG in 2012: Currently no chest pain  4. Diastolic heart failure, chronic: No evidence of volume overload  5. Essential hypertension: Blood pressure well controlled    Current medicines are reviewed at length with the patient today.   The patient does not have concerns regarding his medicines.  The following changes were made today:  none  Labs/ tests ordered  today include:  No orders of the defined types were placed in this encounter.    Disposition:   FU with Ruey Storer 1 year  Signed, Niana Martorana Travis LoaMartin Aevah Stansbery, MD  04/09/2017 3:27 PM     [facility] [address] [place] [id number] [phone] (office) [phone] (fax)

## 2017-04-15 DIAGNOSIS — Z950 Presence of cardiac pacemaker: Secondary | ICD-10-CM | POA: Diagnosis not present

## 2017-04-15 DIAGNOSIS — Z951 Presence of aortocoronary bypass graft: Secondary | ICD-10-CM | POA: Diagnosis not present

## 2017-04-15 DIAGNOSIS — I495 Sick sinus syndrome: Secondary | ICD-10-CM | POA: Diagnosis not present

## 2017-04-15 DIAGNOSIS — Z01818 Encounter for other preprocedural examination: Secondary | ICD-10-CM | POA: Diagnosis not present

## 2017-04-15 DIAGNOSIS — I2510 Atherosclerotic heart disease of native coronary artery without angina pectoris: Secondary | ICD-10-CM | POA: Diagnosis not present

## 2017-04-25 DIAGNOSIS — E1129 Type 2 diabetes mellitus with other diabetic kidney complication: Secondary | ICD-10-CM | POA: Diagnosis not present

## 2017-04-25 DIAGNOSIS — N189 Chronic kidney disease, unspecified: Secondary | ICD-10-CM | POA: Diagnosis not present

## 2017-04-25 DIAGNOSIS — E785 Hyperlipidemia, unspecified: Secondary | ICD-10-CM | POA: Diagnosis not present

## 2017-04-25 DIAGNOSIS — R5382 Chronic fatigue, unspecified: Secondary | ICD-10-CM | POA: Diagnosis not present

## 2017-04-25 DIAGNOSIS — I130 Hypertensive heart and chronic kidney disease with heart failure and stage 1 through stage 4 chronic kidney disease, or unspecified chronic kidney disease: Secondary | ICD-10-CM | POA: Diagnosis not present

## 2017-04-30 DIAGNOSIS — G309 Alzheimer's disease, unspecified: Secondary | ICD-10-CM | POA: Diagnosis not present

## 2017-04-30 DIAGNOSIS — Z8673 Personal history of transient ischemic attack (TIA), and cerebral infarction without residual deficits: Secondary | ICD-10-CM | POA: Diagnosis not present

## 2017-04-30 DIAGNOSIS — D510 Vitamin B12 deficiency anemia due to intrinsic factor deficiency: Secondary | ICD-10-CM | POA: Diagnosis not present

## 2017-05-02 DIAGNOSIS — M25562 Pain in left knee: Secondary | ICD-10-CM | POA: Diagnosis not present

## 2017-05-02 DIAGNOSIS — M159 Polyosteoarthritis, unspecified: Secondary | ICD-10-CM | POA: Diagnosis not present

## 2017-05-07 DIAGNOSIS — D519 Vitamin B12 deficiency anemia, unspecified: Secondary | ICD-10-CM | POA: Diagnosis not present

## 2017-05-14 DIAGNOSIS — D510 Vitamin B12 deficiency anemia due to intrinsic factor deficiency: Secondary | ICD-10-CM | POA: Diagnosis not present

## 2017-05-21 DIAGNOSIS — D519 Vitamin B12 deficiency anemia, unspecified: Secondary | ICD-10-CM | POA: Diagnosis not present

## 2017-05-23 DIAGNOSIS — E039 Hypothyroidism, unspecified: Secondary | ICD-10-CM | POA: Diagnosis not present

## 2017-05-23 DIAGNOSIS — D519 Vitamin B12 deficiency anemia, unspecified: Secondary | ICD-10-CM | POA: Diagnosis not present

## 2017-05-23 DIAGNOSIS — G309 Alzheimer's disease, unspecified: Secondary | ICD-10-CM | POA: Diagnosis not present

## 2017-06-10 DIAGNOSIS — Z136 Encounter for screening for cardiovascular disorders: Secondary | ICD-10-CM | POA: Diagnosis not present

## 2017-06-10 DIAGNOSIS — Z139 Encounter for screening, unspecified: Secondary | ICD-10-CM | POA: Diagnosis not present

## 2017-06-10 DIAGNOSIS — E785 Hyperlipidemia, unspecified: Secondary | ICD-10-CM | POA: Diagnosis not present

## 2017-06-10 DIAGNOSIS — Z1331 Encounter for screening for depression: Secondary | ICD-10-CM | POA: Diagnosis not present

## 2017-06-10 DIAGNOSIS — Z0000 Encounter for general adult medical examination without abnormal findings: Secondary | ICD-10-CM | POA: Diagnosis not present

## 2017-06-10 DIAGNOSIS — Z9181 History of falling: Secondary | ICD-10-CM | POA: Diagnosis not present

## 2017-06-10 DIAGNOSIS — E109 Type 1 diabetes mellitus without complications: Secondary | ICD-10-CM | POA: Diagnosis not present

## 2017-06-10 DIAGNOSIS — Z125 Encounter for screening for malignant neoplasm of prostate: Secondary | ICD-10-CM | POA: Diagnosis not present

## 2017-06-16 DIAGNOSIS — R404 Transient alteration of awareness: Secondary | ICD-10-CM | POA: Diagnosis not present

## 2017-06-16 DIAGNOSIS — R531 Weakness: Secondary | ICD-10-CM | POA: Diagnosis not present

## 2017-06-19 DIAGNOSIS — Z79899 Other long term (current) drug therapy: Secondary | ICD-10-CM | POA: Diagnosis not present

## 2017-06-19 DIAGNOSIS — R531 Weakness: Secondary | ICD-10-CM | POA: Diagnosis not present

## 2017-06-19 DIAGNOSIS — I6529 Occlusion and stenosis of unspecified carotid artery: Secondary | ICD-10-CM | POA: Diagnosis not present

## 2017-06-21 DIAGNOSIS — J029 Acute pharyngitis, unspecified: Secondary | ICD-10-CM | POA: Diagnosis not present

## 2017-06-24 DIAGNOSIS — D519 Vitamin B12 deficiency anemia, unspecified: Secondary | ICD-10-CM | POA: Diagnosis not present

## 2017-06-30 DIAGNOSIS — I6523 Occlusion and stenosis of bilateral carotid arteries: Secondary | ICD-10-CM | POA: Diagnosis not present

## 2017-06-30 DIAGNOSIS — I6529 Occlusion and stenosis of unspecified carotid artery: Secondary | ICD-10-CM | POA: Diagnosis not present

## 2017-07-09 ENCOUNTER — Encounter: Payer: Medicare Other | Admitting: *Deleted

## 2017-07-09 ENCOUNTER — Telehealth: Payer: Self-pay | Admitting: Cardiology

## 2017-07-09 NOTE — Telephone Encounter (Signed)
LMOVM reminding pt to send remote transmission.   

## 2017-07-11 ENCOUNTER — Encounter: Payer: Self-pay | Admitting: Cardiology

## 2017-07-15 DIAGNOSIS — G4700 Insomnia, unspecified: Secondary | ICD-10-CM | POA: Diagnosis not present

## 2017-07-15 DIAGNOSIS — J386 Stenosis of larynx: Secondary | ICD-10-CM | POA: Diagnosis not present

## 2017-07-15 DIAGNOSIS — I639 Cerebral infarction, unspecified: Secondary | ICD-10-CM | POA: Diagnosis not present

## 2017-07-15 DIAGNOSIS — M5136 Other intervertebral disc degeneration, lumbar region: Secondary | ICD-10-CM | POA: Diagnosis not present

## 2017-07-22 DIAGNOSIS — G309 Alzheimer's disease, unspecified: Secondary | ICD-10-CM | POA: Diagnosis not present

## 2017-07-22 DIAGNOSIS — R402441 Other coma, without documented Glasgow coma scale score, or with partial score reported, in the field [EMT or ambulance]: Secondary | ICD-10-CM | POA: Diagnosis not present

## 2017-07-22 DIAGNOSIS — S0990XA Unspecified injury of head, initial encounter: Secondary | ICD-10-CM | POA: Diagnosis not present

## 2017-07-22 DIAGNOSIS — J9811 Atelectasis: Secondary | ICD-10-CM | POA: Diagnosis not present

## 2017-07-23 DIAGNOSIS — E1129 Type 2 diabetes mellitus with other diabetic kidney complication: Secondary | ICD-10-CM | POA: Diagnosis not present

## 2017-07-23 DIAGNOSIS — Z139 Encounter for screening, unspecified: Secondary | ICD-10-CM | POA: Diagnosis not present

## 2017-07-23 DIAGNOSIS — E039 Hypothyroidism, unspecified: Secondary | ICD-10-CM | POA: Diagnosis not present

## 2017-07-23 DIAGNOSIS — D519 Vitamin B12 deficiency anemia, unspecified: Secondary | ICD-10-CM | POA: Diagnosis not present

## 2017-07-23 DIAGNOSIS — I130 Hypertensive heart and chronic kidney disease with heart failure and stage 1 through stage 4 chronic kidney disease, or unspecified chronic kidney disease: Secondary | ICD-10-CM | POA: Diagnosis not present

## 2017-07-23 DIAGNOSIS — E785 Hyperlipidemia, unspecified: Secondary | ICD-10-CM | POA: Diagnosis not present

## 2017-07-23 DIAGNOSIS — N189 Chronic kidney disease, unspecified: Secondary | ICD-10-CM | POA: Diagnosis not present

## 2017-07-26 DIAGNOSIS — I5032 Chronic diastolic (congestive) heart failure: Secondary | ICD-10-CM | POA: Diagnosis not present

## 2017-07-26 DIAGNOSIS — E039 Hypothyroidism, unspecified: Secondary | ICD-10-CM | POA: Diagnosis not present

## 2017-07-26 DIAGNOSIS — I252 Old myocardial infarction: Secondary | ICD-10-CM | POA: Diagnosis not present

## 2017-07-26 DIAGNOSIS — E1122 Type 2 diabetes mellitus with diabetic chronic kidney disease: Secondary | ICD-10-CM | POA: Diagnosis not present

## 2017-07-26 DIAGNOSIS — M5090 Cervical disc disorder, unspecified, unspecified cervical region: Secondary | ICD-10-CM | POA: Diagnosis not present

## 2017-07-26 DIAGNOSIS — M47816 Spondylosis without myelopathy or radiculopathy, lumbar region: Secondary | ICD-10-CM | POA: Diagnosis not present

## 2017-07-26 DIAGNOSIS — M5126 Other intervertebral disc displacement, lumbar region: Secondary | ICD-10-CM | POA: Diagnosis not present

## 2017-07-26 DIAGNOSIS — I4891 Unspecified atrial fibrillation: Secondary | ICD-10-CM | POA: Diagnosis not present

## 2017-07-26 DIAGNOSIS — I130 Hypertensive heart and chronic kidney disease with heart failure and stage 1 through stage 4 chronic kidney disease, or unspecified chronic kidney disease: Secondary | ICD-10-CM | POA: Diagnosis not present

## 2017-07-26 DIAGNOSIS — M48061 Spinal stenosis, lumbar region without neurogenic claudication: Secondary | ICD-10-CM | POA: Diagnosis not present

## 2017-07-26 DIAGNOSIS — R269 Unspecified abnormalities of gait and mobility: Secondary | ICD-10-CM | POA: Diagnosis not present

## 2017-07-26 DIAGNOSIS — H902 Conductive hearing loss, unspecified: Secondary | ICD-10-CM | POA: Diagnosis not present

## 2017-07-26 DIAGNOSIS — M1991 Primary osteoarthritis, unspecified site: Secondary | ICD-10-CM | POA: Diagnosis not present

## 2017-07-26 DIAGNOSIS — K219 Gastro-esophageal reflux disease without esophagitis: Secondary | ICD-10-CM | POA: Diagnosis not present

## 2017-07-26 DIAGNOSIS — E538 Deficiency of other specified B group vitamins: Secondary | ICD-10-CM | POA: Diagnosis not present

## 2017-07-26 DIAGNOSIS — Z7901 Long term (current) use of anticoagulants: Secondary | ICD-10-CM | POA: Diagnosis not present

## 2017-07-26 DIAGNOSIS — I059 Rheumatic mitral valve disease, unspecified: Secondary | ICD-10-CM | POA: Diagnosis not present

## 2017-07-26 DIAGNOSIS — M4722 Other spondylosis with radiculopathy, cervical region: Secondary | ICD-10-CM | POA: Diagnosis not present

## 2017-07-26 DIAGNOSIS — Z9181 History of falling: Secondary | ICD-10-CM | POA: Diagnosis not present

## 2017-07-26 DIAGNOSIS — G309 Alzheimer's disease, unspecified: Secondary | ICD-10-CM | POA: Diagnosis not present

## 2017-07-26 DIAGNOSIS — I442 Atrioventricular block, complete: Secondary | ICD-10-CM | POA: Diagnosis not present

## 2017-07-26 DIAGNOSIS — N189 Chronic kidney disease, unspecified: Secondary | ICD-10-CM | POA: Diagnosis not present

## 2017-07-26 DIAGNOSIS — I2510 Atherosclerotic heart disease of native coronary artery without angina pectoris: Secondary | ICD-10-CM | POA: Diagnosis not present

## 2017-07-30 DIAGNOSIS — K219 Gastro-esophageal reflux disease without esophagitis: Secondary | ICD-10-CM | POA: Diagnosis not present

## 2017-07-30 DIAGNOSIS — E538 Deficiency of other specified B group vitamins: Secondary | ICD-10-CM | POA: Diagnosis not present

## 2017-07-30 DIAGNOSIS — M4722 Other spondylosis with radiculopathy, cervical region: Secondary | ICD-10-CM | POA: Diagnosis not present

## 2017-07-30 DIAGNOSIS — H902 Conductive hearing loss, unspecified: Secondary | ICD-10-CM | POA: Diagnosis not present

## 2017-07-30 DIAGNOSIS — N189 Chronic kidney disease, unspecified: Secondary | ICD-10-CM | POA: Diagnosis not present

## 2017-07-30 DIAGNOSIS — Z9181 History of falling: Secondary | ICD-10-CM | POA: Diagnosis not present

## 2017-07-30 DIAGNOSIS — I2510 Atherosclerotic heart disease of native coronary artery without angina pectoris: Secondary | ICD-10-CM | POA: Diagnosis not present

## 2017-07-30 DIAGNOSIS — I5032 Chronic diastolic (congestive) heart failure: Secondary | ICD-10-CM | POA: Diagnosis not present

## 2017-07-30 DIAGNOSIS — I442 Atrioventricular block, complete: Secondary | ICD-10-CM | POA: Diagnosis not present

## 2017-07-30 DIAGNOSIS — Z7901 Long term (current) use of anticoagulants: Secondary | ICD-10-CM | POA: Diagnosis not present

## 2017-07-30 DIAGNOSIS — I4891 Unspecified atrial fibrillation: Secondary | ICD-10-CM | POA: Diagnosis not present

## 2017-07-30 DIAGNOSIS — I059 Rheumatic mitral valve disease, unspecified: Secondary | ICD-10-CM | POA: Diagnosis not present

## 2017-07-30 DIAGNOSIS — I130 Hypertensive heart and chronic kidney disease with heart failure and stage 1 through stage 4 chronic kidney disease, or unspecified chronic kidney disease: Secondary | ICD-10-CM | POA: Diagnosis not present

## 2017-07-30 DIAGNOSIS — G309 Alzheimer's disease, unspecified: Secondary | ICD-10-CM | POA: Diagnosis not present

## 2017-07-30 DIAGNOSIS — M47816 Spondylosis without myelopathy or radiculopathy, lumbar region: Secondary | ICD-10-CM | POA: Diagnosis not present

## 2017-07-30 DIAGNOSIS — R269 Unspecified abnormalities of gait and mobility: Secondary | ICD-10-CM | POA: Diagnosis not present

## 2017-07-30 DIAGNOSIS — M5090 Cervical disc disorder, unspecified, unspecified cervical region: Secondary | ICD-10-CM | POA: Diagnosis not present

## 2017-07-30 DIAGNOSIS — M5126 Other intervertebral disc displacement, lumbar region: Secondary | ICD-10-CM | POA: Diagnosis not present

## 2017-07-30 DIAGNOSIS — I252 Old myocardial infarction: Secondary | ICD-10-CM | POA: Diagnosis not present

## 2017-07-30 DIAGNOSIS — M48061 Spinal stenosis, lumbar region without neurogenic claudication: Secondary | ICD-10-CM | POA: Diagnosis not present

## 2017-07-30 DIAGNOSIS — E039 Hypothyroidism, unspecified: Secondary | ICD-10-CM | POA: Diagnosis not present

## 2017-07-30 DIAGNOSIS — M1991 Primary osteoarthritis, unspecified site: Secondary | ICD-10-CM | POA: Diagnosis not present

## 2017-07-30 DIAGNOSIS — E1122 Type 2 diabetes mellitus with diabetic chronic kidney disease: Secondary | ICD-10-CM | POA: Diagnosis not present

## 2017-07-31 ENCOUNTER — Encounter: Payer: Medicare Other | Admitting: *Deleted

## 2017-07-31 ENCOUNTER — Telehealth: Payer: Self-pay | Admitting: Cardiology

## 2017-07-31 DIAGNOSIS — M48061 Spinal stenosis, lumbar region without neurogenic claudication: Secondary | ICD-10-CM | POA: Diagnosis not present

## 2017-07-31 DIAGNOSIS — H902 Conductive hearing loss, unspecified: Secondary | ICD-10-CM | POA: Diagnosis not present

## 2017-07-31 DIAGNOSIS — I130 Hypertensive heart and chronic kidney disease with heart failure and stage 1 through stage 4 chronic kidney disease, or unspecified chronic kidney disease: Secondary | ICD-10-CM | POA: Diagnosis not present

## 2017-07-31 DIAGNOSIS — M47816 Spondylosis without myelopathy or radiculopathy, lumbar region: Secondary | ICD-10-CM | POA: Diagnosis not present

## 2017-07-31 DIAGNOSIS — Z9181 History of falling: Secondary | ICD-10-CM | POA: Diagnosis not present

## 2017-07-31 DIAGNOSIS — G309 Alzheimer's disease, unspecified: Secondary | ICD-10-CM | POA: Diagnosis not present

## 2017-07-31 DIAGNOSIS — I4891 Unspecified atrial fibrillation: Secondary | ICD-10-CM | POA: Diagnosis not present

## 2017-07-31 DIAGNOSIS — I059 Rheumatic mitral valve disease, unspecified: Secondary | ICD-10-CM | POA: Diagnosis not present

## 2017-07-31 DIAGNOSIS — I2510 Atherosclerotic heart disease of native coronary artery without angina pectoris: Secondary | ICD-10-CM | POA: Diagnosis not present

## 2017-07-31 DIAGNOSIS — Z7901 Long term (current) use of anticoagulants: Secondary | ICD-10-CM | POA: Diagnosis not present

## 2017-07-31 DIAGNOSIS — R269 Unspecified abnormalities of gait and mobility: Secondary | ICD-10-CM | POA: Diagnosis not present

## 2017-07-31 DIAGNOSIS — E538 Deficiency of other specified B group vitamins: Secondary | ICD-10-CM | POA: Diagnosis not present

## 2017-07-31 DIAGNOSIS — I5032 Chronic diastolic (congestive) heart failure: Secondary | ICD-10-CM | POA: Diagnosis not present

## 2017-07-31 DIAGNOSIS — M1991 Primary osteoarthritis, unspecified site: Secondary | ICD-10-CM | POA: Diagnosis not present

## 2017-07-31 DIAGNOSIS — N189 Chronic kidney disease, unspecified: Secondary | ICD-10-CM | POA: Diagnosis not present

## 2017-07-31 DIAGNOSIS — E1122 Type 2 diabetes mellitus with diabetic chronic kidney disease: Secondary | ICD-10-CM | POA: Diagnosis not present

## 2017-07-31 DIAGNOSIS — I639 Cerebral infarction, unspecified: Secondary | ICD-10-CM | POA: Diagnosis not present

## 2017-07-31 DIAGNOSIS — M5090 Cervical disc disorder, unspecified, unspecified cervical region: Secondary | ICD-10-CM | POA: Diagnosis not present

## 2017-07-31 DIAGNOSIS — M5126 Other intervertebral disc displacement, lumbar region: Secondary | ICD-10-CM | POA: Diagnosis not present

## 2017-07-31 DIAGNOSIS — E039 Hypothyroidism, unspecified: Secondary | ICD-10-CM | POA: Diagnosis not present

## 2017-07-31 DIAGNOSIS — I442 Atrioventricular block, complete: Secondary | ICD-10-CM | POA: Diagnosis not present

## 2017-07-31 DIAGNOSIS — M4722 Other spondylosis with radiculopathy, cervical region: Secondary | ICD-10-CM | POA: Diagnosis not present

## 2017-07-31 DIAGNOSIS — K219 Gastro-esophageal reflux disease without esophagitis: Secondary | ICD-10-CM | POA: Diagnosis not present

## 2017-07-31 DIAGNOSIS — I252 Old myocardial infarction: Secondary | ICD-10-CM | POA: Diagnosis not present

## 2017-07-31 NOTE — Telephone Encounter (Signed)
Confirmed remote transmission w/ pt son in law.   

## 2017-08-01 ENCOUNTER — Telehealth: Payer: Self-pay | Admitting: Cardiology

## 2017-08-01 ENCOUNTER — Encounter: Payer: Self-pay | Admitting: Cardiology

## 2017-08-01 DIAGNOSIS — H902 Conductive hearing loss, unspecified: Secondary | ICD-10-CM | POA: Diagnosis not present

## 2017-08-01 DIAGNOSIS — I252 Old myocardial infarction: Secondary | ICD-10-CM | POA: Diagnosis not present

## 2017-08-01 DIAGNOSIS — I5032 Chronic diastolic (congestive) heart failure: Secondary | ICD-10-CM | POA: Diagnosis not present

## 2017-08-01 DIAGNOSIS — M5090 Cervical disc disorder, unspecified, unspecified cervical region: Secondary | ICD-10-CM | POA: Diagnosis not present

## 2017-08-01 DIAGNOSIS — R269 Unspecified abnormalities of gait and mobility: Secondary | ICD-10-CM | POA: Diagnosis not present

## 2017-08-01 DIAGNOSIS — E538 Deficiency of other specified B group vitamins: Secondary | ICD-10-CM | POA: Diagnosis not present

## 2017-08-01 DIAGNOSIS — E1122 Type 2 diabetes mellitus with diabetic chronic kidney disease: Secondary | ICD-10-CM | POA: Diagnosis not present

## 2017-08-01 DIAGNOSIS — I130 Hypertensive heart and chronic kidney disease with heart failure and stage 1 through stage 4 chronic kidney disease, or unspecified chronic kidney disease: Secondary | ICD-10-CM | POA: Diagnosis not present

## 2017-08-01 DIAGNOSIS — I059 Rheumatic mitral valve disease, unspecified: Secondary | ICD-10-CM | POA: Diagnosis not present

## 2017-08-01 DIAGNOSIS — M1991 Primary osteoarthritis, unspecified site: Secondary | ICD-10-CM | POA: Diagnosis not present

## 2017-08-01 DIAGNOSIS — Z9181 History of falling: Secondary | ICD-10-CM | POA: Diagnosis not present

## 2017-08-01 DIAGNOSIS — I4891 Unspecified atrial fibrillation: Secondary | ICD-10-CM | POA: Diagnosis not present

## 2017-08-01 DIAGNOSIS — M47816 Spondylosis without myelopathy or radiculopathy, lumbar region: Secondary | ICD-10-CM | POA: Diagnosis not present

## 2017-08-01 DIAGNOSIS — E039 Hypothyroidism, unspecified: Secondary | ICD-10-CM | POA: Diagnosis not present

## 2017-08-01 DIAGNOSIS — M5126 Other intervertebral disc displacement, lumbar region: Secondary | ICD-10-CM | POA: Diagnosis not present

## 2017-08-01 DIAGNOSIS — G309 Alzheimer's disease, unspecified: Secondary | ICD-10-CM | POA: Diagnosis not present

## 2017-08-01 DIAGNOSIS — I2510 Atherosclerotic heart disease of native coronary artery without angina pectoris: Secondary | ICD-10-CM | POA: Diagnosis not present

## 2017-08-01 DIAGNOSIS — M4722 Other spondylosis with radiculopathy, cervical region: Secondary | ICD-10-CM | POA: Diagnosis not present

## 2017-08-01 DIAGNOSIS — K219 Gastro-esophageal reflux disease without esophagitis: Secondary | ICD-10-CM | POA: Diagnosis not present

## 2017-08-01 DIAGNOSIS — M48061 Spinal stenosis, lumbar region without neurogenic claudication: Secondary | ICD-10-CM | POA: Diagnosis not present

## 2017-08-01 DIAGNOSIS — I442 Atrioventricular block, complete: Secondary | ICD-10-CM | POA: Diagnosis not present

## 2017-08-01 DIAGNOSIS — Z7901 Long term (current) use of anticoagulants: Secondary | ICD-10-CM | POA: Diagnosis not present

## 2017-08-01 DIAGNOSIS — N189 Chronic kidney disease, unspecified: Secondary | ICD-10-CM | POA: Diagnosis not present

## 2017-08-01 NOTE — Telephone Encounter (Signed)
Confirmed that we successfully received remote transmission.  

## 2017-08-01 NOTE — Telephone Encounter (Signed)
New message:   1. Has your device fired? No  2. Is you device beeping? No  3. Are you experiencing draining or swelling at device site? No  4. Are you calling to see if we received your device transmission? yes  5. Have you passed out? No    Please route to Device Clinic Pool

## 2017-08-04 DIAGNOSIS — Z9181 History of falling: Secondary | ICD-10-CM | POA: Diagnosis not present

## 2017-08-04 DIAGNOSIS — H902 Conductive hearing loss, unspecified: Secondary | ICD-10-CM | POA: Diagnosis not present

## 2017-08-04 DIAGNOSIS — N189 Chronic kidney disease, unspecified: Secondary | ICD-10-CM | POA: Diagnosis not present

## 2017-08-04 DIAGNOSIS — M48061 Spinal stenosis, lumbar region without neurogenic claudication: Secondary | ICD-10-CM | POA: Diagnosis not present

## 2017-08-04 DIAGNOSIS — Z7901 Long term (current) use of anticoagulants: Secondary | ICD-10-CM | POA: Diagnosis not present

## 2017-08-04 DIAGNOSIS — E1122 Type 2 diabetes mellitus with diabetic chronic kidney disease: Secondary | ICD-10-CM | POA: Diagnosis not present

## 2017-08-04 DIAGNOSIS — E538 Deficiency of other specified B group vitamins: Secondary | ICD-10-CM | POA: Diagnosis not present

## 2017-08-04 DIAGNOSIS — M5126 Other intervertebral disc displacement, lumbar region: Secondary | ICD-10-CM | POA: Diagnosis not present

## 2017-08-04 DIAGNOSIS — M1991 Primary osteoarthritis, unspecified site: Secondary | ICD-10-CM | POA: Diagnosis not present

## 2017-08-04 DIAGNOSIS — M47816 Spondylosis without myelopathy or radiculopathy, lumbar region: Secondary | ICD-10-CM | POA: Diagnosis not present

## 2017-08-04 DIAGNOSIS — I252 Old myocardial infarction: Secondary | ICD-10-CM | POA: Diagnosis not present

## 2017-08-04 DIAGNOSIS — M5090 Cervical disc disorder, unspecified, unspecified cervical region: Secondary | ICD-10-CM | POA: Diagnosis not present

## 2017-08-04 DIAGNOSIS — I059 Rheumatic mitral valve disease, unspecified: Secondary | ICD-10-CM | POA: Diagnosis not present

## 2017-08-04 DIAGNOSIS — I130 Hypertensive heart and chronic kidney disease with heart failure and stage 1 through stage 4 chronic kidney disease, or unspecified chronic kidney disease: Secondary | ICD-10-CM | POA: Diagnosis not present

## 2017-08-04 DIAGNOSIS — M4722 Other spondylosis with radiculopathy, cervical region: Secondary | ICD-10-CM | POA: Diagnosis not present

## 2017-08-04 DIAGNOSIS — I442 Atrioventricular block, complete: Secondary | ICD-10-CM | POA: Diagnosis not present

## 2017-08-04 DIAGNOSIS — I4891 Unspecified atrial fibrillation: Secondary | ICD-10-CM | POA: Diagnosis not present

## 2017-08-04 DIAGNOSIS — G309 Alzheimer's disease, unspecified: Secondary | ICD-10-CM | POA: Diagnosis not present

## 2017-08-04 DIAGNOSIS — I2510 Atherosclerotic heart disease of native coronary artery without angina pectoris: Secondary | ICD-10-CM | POA: Diagnosis not present

## 2017-08-04 DIAGNOSIS — R269 Unspecified abnormalities of gait and mobility: Secondary | ICD-10-CM | POA: Diagnosis not present

## 2017-08-04 DIAGNOSIS — E039 Hypothyroidism, unspecified: Secondary | ICD-10-CM | POA: Diagnosis not present

## 2017-08-04 DIAGNOSIS — I5032 Chronic diastolic (congestive) heart failure: Secondary | ICD-10-CM | POA: Diagnosis not present

## 2017-08-04 DIAGNOSIS — K219 Gastro-esophageal reflux disease without esophagitis: Secondary | ICD-10-CM | POA: Diagnosis not present

## 2017-08-06 DIAGNOSIS — M5126 Other intervertebral disc displacement, lumbar region: Secondary | ICD-10-CM | POA: Diagnosis not present

## 2017-08-06 DIAGNOSIS — M48061 Spinal stenosis, lumbar region without neurogenic claudication: Secondary | ICD-10-CM | POA: Diagnosis not present

## 2017-08-06 DIAGNOSIS — Z9181 History of falling: Secondary | ICD-10-CM | POA: Diagnosis not present

## 2017-08-06 DIAGNOSIS — I4891 Unspecified atrial fibrillation: Secondary | ICD-10-CM | POA: Diagnosis not present

## 2017-08-06 DIAGNOSIS — H902 Conductive hearing loss, unspecified: Secondary | ICD-10-CM | POA: Diagnosis not present

## 2017-08-06 DIAGNOSIS — I442 Atrioventricular block, complete: Secondary | ICD-10-CM | POA: Diagnosis not present

## 2017-08-06 DIAGNOSIS — I252 Old myocardial infarction: Secondary | ICD-10-CM | POA: Diagnosis not present

## 2017-08-06 DIAGNOSIS — M4722 Other spondylosis with radiculopathy, cervical region: Secondary | ICD-10-CM | POA: Diagnosis not present

## 2017-08-06 DIAGNOSIS — N189 Chronic kidney disease, unspecified: Secondary | ICD-10-CM | POA: Diagnosis not present

## 2017-08-06 DIAGNOSIS — I059 Rheumatic mitral valve disease, unspecified: Secondary | ICD-10-CM | POA: Diagnosis not present

## 2017-08-06 DIAGNOSIS — M1991 Primary osteoarthritis, unspecified site: Secondary | ICD-10-CM | POA: Diagnosis not present

## 2017-08-06 DIAGNOSIS — M5090 Cervical disc disorder, unspecified, unspecified cervical region: Secondary | ICD-10-CM | POA: Diagnosis not present

## 2017-08-06 DIAGNOSIS — Z7901 Long term (current) use of anticoagulants: Secondary | ICD-10-CM | POA: Diagnosis not present

## 2017-08-06 DIAGNOSIS — K219 Gastro-esophageal reflux disease without esophagitis: Secondary | ICD-10-CM | POA: Diagnosis not present

## 2017-08-06 DIAGNOSIS — M47816 Spondylosis without myelopathy or radiculopathy, lumbar region: Secondary | ICD-10-CM | POA: Diagnosis not present

## 2017-08-06 DIAGNOSIS — I5032 Chronic diastolic (congestive) heart failure: Secondary | ICD-10-CM | POA: Diagnosis not present

## 2017-08-06 DIAGNOSIS — I130 Hypertensive heart and chronic kidney disease with heart failure and stage 1 through stage 4 chronic kidney disease, or unspecified chronic kidney disease: Secondary | ICD-10-CM | POA: Diagnosis not present

## 2017-08-06 DIAGNOSIS — E1122 Type 2 diabetes mellitus with diabetic chronic kidney disease: Secondary | ICD-10-CM | POA: Diagnosis not present

## 2017-08-06 DIAGNOSIS — G309 Alzheimer's disease, unspecified: Secondary | ICD-10-CM | POA: Diagnosis not present

## 2017-08-06 DIAGNOSIS — R269 Unspecified abnormalities of gait and mobility: Secondary | ICD-10-CM | POA: Diagnosis not present

## 2017-08-06 DIAGNOSIS — E538 Deficiency of other specified B group vitamins: Secondary | ICD-10-CM | POA: Diagnosis not present

## 2017-08-06 DIAGNOSIS — E039 Hypothyroidism, unspecified: Secondary | ICD-10-CM | POA: Diagnosis not present

## 2017-08-06 DIAGNOSIS — I2510 Atherosclerotic heart disease of native coronary artery without angina pectoris: Secondary | ICD-10-CM | POA: Diagnosis not present

## 2017-08-11 DIAGNOSIS — N189 Chronic kidney disease, unspecified: Secondary | ICD-10-CM | POA: Diagnosis not present

## 2017-08-11 DIAGNOSIS — E538 Deficiency of other specified B group vitamins: Secondary | ICD-10-CM | POA: Diagnosis not present

## 2017-08-11 DIAGNOSIS — Z9181 History of falling: Secondary | ICD-10-CM | POA: Diagnosis not present

## 2017-08-11 DIAGNOSIS — G309 Alzheimer's disease, unspecified: Secondary | ICD-10-CM | POA: Diagnosis not present

## 2017-08-11 DIAGNOSIS — M4722 Other spondylosis with radiculopathy, cervical region: Secondary | ICD-10-CM | POA: Diagnosis not present

## 2017-08-11 DIAGNOSIS — E039 Hypothyroidism, unspecified: Secondary | ICD-10-CM | POA: Diagnosis not present

## 2017-08-11 DIAGNOSIS — I4891 Unspecified atrial fibrillation: Secondary | ICD-10-CM | POA: Diagnosis not present

## 2017-08-11 DIAGNOSIS — E1122 Type 2 diabetes mellitus with diabetic chronic kidney disease: Secondary | ICD-10-CM | POA: Diagnosis not present

## 2017-08-11 DIAGNOSIS — I5032 Chronic diastolic (congestive) heart failure: Secondary | ICD-10-CM | POA: Diagnosis not present

## 2017-08-11 DIAGNOSIS — I130 Hypertensive heart and chronic kidney disease with heart failure and stage 1 through stage 4 chronic kidney disease, or unspecified chronic kidney disease: Secondary | ICD-10-CM | POA: Diagnosis not present

## 2017-08-11 DIAGNOSIS — M5126 Other intervertebral disc displacement, lumbar region: Secondary | ICD-10-CM | POA: Diagnosis not present

## 2017-08-11 DIAGNOSIS — I059 Rheumatic mitral valve disease, unspecified: Secondary | ICD-10-CM | POA: Diagnosis not present

## 2017-08-11 DIAGNOSIS — I2510 Atherosclerotic heart disease of native coronary artery without angina pectoris: Secondary | ICD-10-CM | POA: Diagnosis not present

## 2017-08-11 DIAGNOSIS — Z7901 Long term (current) use of anticoagulants: Secondary | ICD-10-CM | POA: Diagnosis not present

## 2017-08-11 DIAGNOSIS — M47816 Spondylosis without myelopathy or radiculopathy, lumbar region: Secondary | ICD-10-CM | POA: Diagnosis not present

## 2017-08-11 DIAGNOSIS — M5090 Cervical disc disorder, unspecified, unspecified cervical region: Secondary | ICD-10-CM | POA: Diagnosis not present

## 2017-08-11 DIAGNOSIS — I442 Atrioventricular block, complete: Secondary | ICD-10-CM | POA: Diagnosis not present

## 2017-08-11 DIAGNOSIS — M48061 Spinal stenosis, lumbar region without neurogenic claudication: Secondary | ICD-10-CM | POA: Diagnosis not present

## 2017-08-11 DIAGNOSIS — I252 Old myocardial infarction: Secondary | ICD-10-CM | POA: Diagnosis not present

## 2017-08-11 DIAGNOSIS — R269 Unspecified abnormalities of gait and mobility: Secondary | ICD-10-CM | POA: Diagnosis not present

## 2017-08-11 DIAGNOSIS — K219 Gastro-esophageal reflux disease without esophagitis: Secondary | ICD-10-CM | POA: Diagnosis not present

## 2017-08-11 DIAGNOSIS — H902 Conductive hearing loss, unspecified: Secondary | ICD-10-CM | POA: Diagnosis not present

## 2017-08-11 DIAGNOSIS — M1991 Primary osteoarthritis, unspecified site: Secondary | ICD-10-CM | POA: Diagnosis not present

## 2017-08-12 DIAGNOSIS — K219 Gastro-esophageal reflux disease without esophagitis: Secondary | ICD-10-CM | POA: Diagnosis not present

## 2017-08-12 DIAGNOSIS — R1310 Dysphagia, unspecified: Secondary | ICD-10-CM | POA: Diagnosis not present

## 2017-08-18 DIAGNOSIS — I252 Old myocardial infarction: Secondary | ICD-10-CM | POA: Diagnosis not present

## 2017-08-18 DIAGNOSIS — I130 Hypertensive heart and chronic kidney disease with heart failure and stage 1 through stage 4 chronic kidney disease, or unspecified chronic kidney disease: Secondary | ICD-10-CM | POA: Diagnosis not present

## 2017-08-18 DIAGNOSIS — I4891 Unspecified atrial fibrillation: Secondary | ICD-10-CM | POA: Diagnosis not present

## 2017-08-18 DIAGNOSIS — G309 Alzheimer's disease, unspecified: Secondary | ICD-10-CM | POA: Diagnosis not present

## 2017-08-18 DIAGNOSIS — E538 Deficiency of other specified B group vitamins: Secondary | ICD-10-CM | POA: Diagnosis not present

## 2017-08-18 DIAGNOSIS — N189 Chronic kidney disease, unspecified: Secondary | ICD-10-CM | POA: Diagnosis not present

## 2017-08-18 DIAGNOSIS — Z7901 Long term (current) use of anticoagulants: Secondary | ICD-10-CM | POA: Diagnosis not present

## 2017-08-18 DIAGNOSIS — M4722 Other spondylosis with radiculopathy, cervical region: Secondary | ICD-10-CM | POA: Diagnosis not present

## 2017-08-18 DIAGNOSIS — K219 Gastro-esophageal reflux disease without esophagitis: Secondary | ICD-10-CM | POA: Diagnosis not present

## 2017-08-18 DIAGNOSIS — E1122 Type 2 diabetes mellitus with diabetic chronic kidney disease: Secondary | ICD-10-CM | POA: Diagnosis not present

## 2017-08-18 DIAGNOSIS — M5126 Other intervertebral disc displacement, lumbar region: Secondary | ICD-10-CM | POA: Diagnosis not present

## 2017-08-18 DIAGNOSIS — H902 Conductive hearing loss, unspecified: Secondary | ICD-10-CM | POA: Diagnosis not present

## 2017-08-18 DIAGNOSIS — Z9181 History of falling: Secondary | ICD-10-CM | POA: Diagnosis not present

## 2017-08-18 DIAGNOSIS — M47816 Spondylosis without myelopathy or radiculopathy, lumbar region: Secondary | ICD-10-CM | POA: Diagnosis not present

## 2017-08-18 DIAGNOSIS — M5090 Cervical disc disorder, unspecified, unspecified cervical region: Secondary | ICD-10-CM | POA: Diagnosis not present

## 2017-08-18 DIAGNOSIS — I059 Rheumatic mitral valve disease, unspecified: Secondary | ICD-10-CM | POA: Diagnosis not present

## 2017-08-18 DIAGNOSIS — M1991 Primary osteoarthritis, unspecified site: Secondary | ICD-10-CM | POA: Diagnosis not present

## 2017-08-18 DIAGNOSIS — I2510 Atherosclerotic heart disease of native coronary artery without angina pectoris: Secondary | ICD-10-CM | POA: Diagnosis not present

## 2017-08-18 DIAGNOSIS — E039 Hypothyroidism, unspecified: Secondary | ICD-10-CM | POA: Diagnosis not present

## 2017-08-18 DIAGNOSIS — R269 Unspecified abnormalities of gait and mobility: Secondary | ICD-10-CM | POA: Diagnosis not present

## 2017-08-18 DIAGNOSIS — M48061 Spinal stenosis, lumbar region without neurogenic claudication: Secondary | ICD-10-CM | POA: Diagnosis not present

## 2017-08-18 DIAGNOSIS — I5032 Chronic diastolic (congestive) heart failure: Secondary | ICD-10-CM | POA: Diagnosis not present

## 2017-08-18 DIAGNOSIS — I442 Atrioventricular block, complete: Secondary | ICD-10-CM | POA: Diagnosis not present

## 2017-08-25 DIAGNOSIS — M47816 Spondylosis without myelopathy or radiculopathy, lumbar region: Secondary | ICD-10-CM | POA: Diagnosis not present

## 2017-08-25 DIAGNOSIS — I059 Rheumatic mitral valve disease, unspecified: Secondary | ICD-10-CM | POA: Diagnosis not present

## 2017-08-25 DIAGNOSIS — M4722 Other spondylosis with radiculopathy, cervical region: Secondary | ICD-10-CM | POA: Diagnosis not present

## 2017-08-25 DIAGNOSIS — I252 Old myocardial infarction: Secondary | ICD-10-CM | POA: Diagnosis not present

## 2017-08-25 DIAGNOSIS — G309 Alzheimer's disease, unspecified: Secondary | ICD-10-CM | POA: Diagnosis not present

## 2017-08-25 DIAGNOSIS — I4891 Unspecified atrial fibrillation: Secondary | ICD-10-CM | POA: Diagnosis not present

## 2017-08-25 DIAGNOSIS — I442 Atrioventricular block, complete: Secondary | ICD-10-CM | POA: Diagnosis not present

## 2017-08-25 DIAGNOSIS — E538 Deficiency of other specified B group vitamins: Secondary | ICD-10-CM | POA: Diagnosis not present

## 2017-08-25 DIAGNOSIS — H902 Conductive hearing loss, unspecified: Secondary | ICD-10-CM | POA: Diagnosis not present

## 2017-08-25 DIAGNOSIS — M1991 Primary osteoarthritis, unspecified site: Secondary | ICD-10-CM | POA: Diagnosis not present

## 2017-08-25 DIAGNOSIS — N189 Chronic kidney disease, unspecified: Secondary | ICD-10-CM | POA: Diagnosis not present

## 2017-08-25 DIAGNOSIS — Z7901 Long term (current) use of anticoagulants: Secondary | ICD-10-CM | POA: Diagnosis not present

## 2017-08-25 DIAGNOSIS — M5126 Other intervertebral disc displacement, lumbar region: Secondary | ICD-10-CM | POA: Diagnosis not present

## 2017-08-25 DIAGNOSIS — M48061 Spinal stenosis, lumbar region without neurogenic claudication: Secondary | ICD-10-CM | POA: Diagnosis not present

## 2017-08-25 DIAGNOSIS — I130 Hypertensive heart and chronic kidney disease with heart failure and stage 1 through stage 4 chronic kidney disease, or unspecified chronic kidney disease: Secondary | ICD-10-CM | POA: Diagnosis not present

## 2017-08-25 DIAGNOSIS — R269 Unspecified abnormalities of gait and mobility: Secondary | ICD-10-CM | POA: Diagnosis not present

## 2017-08-25 DIAGNOSIS — E1122 Type 2 diabetes mellitus with diabetic chronic kidney disease: Secondary | ICD-10-CM | POA: Diagnosis not present

## 2017-08-25 DIAGNOSIS — Z9181 History of falling: Secondary | ICD-10-CM | POA: Diagnosis not present

## 2017-08-25 DIAGNOSIS — M5090 Cervical disc disorder, unspecified, unspecified cervical region: Secondary | ICD-10-CM | POA: Diagnosis not present

## 2017-08-25 DIAGNOSIS — K219 Gastro-esophageal reflux disease without esophagitis: Secondary | ICD-10-CM | POA: Diagnosis not present

## 2017-08-25 DIAGNOSIS — E039 Hypothyroidism, unspecified: Secondary | ICD-10-CM | POA: Diagnosis not present

## 2017-08-25 DIAGNOSIS — I2510 Atherosclerotic heart disease of native coronary artery without angina pectoris: Secondary | ICD-10-CM | POA: Diagnosis not present

## 2017-08-25 DIAGNOSIS — I5032 Chronic diastolic (congestive) heart failure: Secondary | ICD-10-CM | POA: Diagnosis not present

## 2017-08-27 DIAGNOSIS — D519 Vitamin B12 deficiency anemia, unspecified: Secondary | ICD-10-CM | POA: Diagnosis not present

## 2017-08-31 DIAGNOSIS — I639 Cerebral infarction, unspecified: Secondary | ICD-10-CM | POA: Diagnosis not present

## 2017-09-01 DIAGNOSIS — I252 Old myocardial infarction: Secondary | ICD-10-CM | POA: Diagnosis not present

## 2017-09-01 DIAGNOSIS — M1991 Primary osteoarthritis, unspecified site: Secondary | ICD-10-CM | POA: Diagnosis not present

## 2017-09-01 DIAGNOSIS — N189 Chronic kidney disease, unspecified: Secondary | ICD-10-CM | POA: Diagnosis not present

## 2017-09-01 DIAGNOSIS — Z9181 History of falling: Secondary | ICD-10-CM | POA: Diagnosis not present

## 2017-09-01 DIAGNOSIS — M5090 Cervical disc disorder, unspecified, unspecified cervical region: Secondary | ICD-10-CM | POA: Diagnosis not present

## 2017-09-01 DIAGNOSIS — M5126 Other intervertebral disc displacement, lumbar region: Secondary | ICD-10-CM | POA: Diagnosis not present

## 2017-09-01 DIAGNOSIS — H902 Conductive hearing loss, unspecified: Secondary | ICD-10-CM | POA: Diagnosis not present

## 2017-09-01 DIAGNOSIS — R269 Unspecified abnormalities of gait and mobility: Secondary | ICD-10-CM | POA: Diagnosis not present

## 2017-09-01 DIAGNOSIS — I442 Atrioventricular block, complete: Secondary | ICD-10-CM | POA: Diagnosis not present

## 2017-09-01 DIAGNOSIS — E538 Deficiency of other specified B group vitamins: Secondary | ICD-10-CM | POA: Diagnosis not present

## 2017-09-01 DIAGNOSIS — I059 Rheumatic mitral valve disease, unspecified: Secondary | ICD-10-CM | POA: Diagnosis not present

## 2017-09-01 DIAGNOSIS — I2510 Atherosclerotic heart disease of native coronary artery without angina pectoris: Secondary | ICD-10-CM | POA: Diagnosis not present

## 2017-09-01 DIAGNOSIS — M47816 Spondylosis without myelopathy or radiculopathy, lumbar region: Secondary | ICD-10-CM | POA: Diagnosis not present

## 2017-09-01 DIAGNOSIS — Z7901 Long term (current) use of anticoagulants: Secondary | ICD-10-CM | POA: Diagnosis not present

## 2017-09-01 DIAGNOSIS — K219 Gastro-esophageal reflux disease without esophagitis: Secondary | ICD-10-CM | POA: Diagnosis not present

## 2017-09-01 DIAGNOSIS — M4722 Other spondylosis with radiculopathy, cervical region: Secondary | ICD-10-CM | POA: Diagnosis not present

## 2017-09-01 DIAGNOSIS — I5032 Chronic diastolic (congestive) heart failure: Secondary | ICD-10-CM | POA: Diagnosis not present

## 2017-09-01 DIAGNOSIS — E1122 Type 2 diabetes mellitus with diabetic chronic kidney disease: Secondary | ICD-10-CM | POA: Diagnosis not present

## 2017-09-01 DIAGNOSIS — I130 Hypertensive heart and chronic kidney disease with heart failure and stage 1 through stage 4 chronic kidney disease, or unspecified chronic kidney disease: Secondary | ICD-10-CM | POA: Diagnosis not present

## 2017-09-01 DIAGNOSIS — I639 Cerebral infarction, unspecified: Secondary | ICD-10-CM | POA: Diagnosis not present

## 2017-09-01 DIAGNOSIS — E039 Hypothyroidism, unspecified: Secondary | ICD-10-CM | POA: Diagnosis not present

## 2017-09-01 DIAGNOSIS — G309 Alzheimer's disease, unspecified: Secondary | ICD-10-CM | POA: Diagnosis not present

## 2017-09-01 DIAGNOSIS — I4891 Unspecified atrial fibrillation: Secondary | ICD-10-CM | POA: Diagnosis not present

## 2017-09-01 DIAGNOSIS — M48061 Spinal stenosis, lumbar region without neurogenic claudication: Secondary | ICD-10-CM | POA: Diagnosis not present

## 2017-09-09 DIAGNOSIS — R1310 Dysphagia, unspecified: Secondary | ICD-10-CM | POA: Diagnosis not present

## 2017-09-24 ENCOUNTER — Telehealth: Payer: Self-pay | Admitting: *Deleted

## 2017-09-24 NOTE — Telephone Encounter (Signed)
Informed pt is not scheduled for follow up until September of this year.  He asked when next home remote check is supposed to be.  After discussing w/ device clinic, instructed to send transmission today and we will schedule them every 91 days there after.  Son-in-law verbalized understanding and agreeable to plan.

## 2017-09-24 NOTE — Telephone Encounter (Signed)
-----   Message from Minette BrineLisa B Welch sent at 09/24/2017  8:23 AM EST ----- Regarding: Pacemaker Due again Contact: [phone] Patient's son is wondering when he is due again for a device check.

## 2017-09-26 ENCOUNTER — Encounter: Payer: Self-pay | Admitting: Cardiology

## 2017-09-26 ENCOUNTER — Ambulatory Visit (INDEPENDENT_AMBULATORY_CARE_PROVIDER_SITE_OTHER): Payer: Medicare Other | Admitting: *Deleted

## 2017-09-26 DIAGNOSIS — I495 Sick sinus syndrome: Secondary | ICD-10-CM | POA: Diagnosis not present

## 2017-09-26 NOTE — Progress Notes (Signed)
Remote pacemaker transmission.   

## 2017-09-29 DIAGNOSIS — D519 Vitamin B12 deficiency anemia, unspecified: Secondary | ICD-10-CM | POA: Diagnosis not present

## 2017-09-29 LAB — CUP PACEART REMOTE DEVICE CHECK
Battery Remaining Longevity: 47 mo
Battery Voltage: 2.99 V
Brady Statistic AP VP Percent: 78.04 %
Brady Statistic AS VP Percent: 21.94 %
Brady Statistic AS VS Percent: 0.01 %
Brady Statistic RA Percent Paced: 77.96 %
Implantable Lead Implant Date: 20141006
Implantable Lead Implant Date: 20141006
Implantable Lead Location: 753859
Implantable Lead Location: 753860
Implantable Pulse Generator Implant Date: 20141006
Lead Channel Impedance Value: 399 Ohm
Lead Channel Impedance Value: 475 Ohm
Lead Channel Pacing Threshold Amplitude: 0.75 V
Lead Channel Pacing Threshold Amplitude: 1 V
Lead Channel Pacing Threshold Pulse Width: 0.4 ms
Lead Channel Pacing Threshold Pulse Width: 0.4 ms
Lead Channel Sensing Intrinsic Amplitude: 1.5 mV
Lead Channel Sensing Intrinsic Amplitude: 1.5 mV
Lead Channel Sensing Intrinsic Amplitude: 23.375 mV
Lead Channel Setting Pacing Pulse Width: 0.4 ms
Lead Channel Setting Sensing Sensitivity: 2 mV
MDC IDC MSMT LEADCHNL RV IMPEDANCE VALUE: 532 Ohm
MDC IDC MSMT LEADCHNL RV IMPEDANCE VALUE: 646 Ohm
MDC IDC MSMT LEADCHNL RV SENSING INTR AMPL: 23.375 mV
MDC IDC SESS DTM: 20190222210412
MDC IDC SET LEADCHNL RA PACING AMPLITUDE: 2.5 V
MDC IDC SET LEADCHNL RV PACING AMPLITUDE: 2.5 V
MDC IDC STAT BRADY AP VS PERCENT: 0 %
MDC IDC STAT BRADY RV PERCENT PACED: 99.83 %

## 2017-10-01 DIAGNOSIS — I639 Cerebral infarction, unspecified: Secondary | ICD-10-CM | POA: Diagnosis not present

## 2017-10-02 DIAGNOSIS — S91009A Unspecified open wound, unspecified ankle, initial encounter: Secondary | ICD-10-CM | POA: Diagnosis not present

## 2017-10-06 ENCOUNTER — Ambulatory Visit (INDEPENDENT_AMBULATORY_CARE_PROVIDER_SITE_OTHER): Payer: Medicare Other | Admitting: Podiatry

## 2017-10-06 DIAGNOSIS — I83015 Varicose veins of right lower extremity with ulcer other part of foot: Secondary | ICD-10-CM

## 2017-10-06 DIAGNOSIS — L97511 Non-pressure chronic ulcer of other part of right foot limited to breakdown of skin: Secondary | ICD-10-CM

## 2017-10-06 DIAGNOSIS — I83028 Varicose veins of left lower extremity with ulcer other part of lower leg: Secondary | ICD-10-CM

## 2017-10-06 DIAGNOSIS — L97821 Non-pressure chronic ulcer of other part of left lower leg limited to breakdown of skin: Secondary | ICD-10-CM

## 2017-10-06 MED ORDER — MEDIHONEY WOUND/BURN DRESSING EX GEL: CUTANEOUS | 1 refills | Status: AC

## 2017-10-08 NOTE — Progress Notes (Signed)
Cardiology Office Note:    Date:  10/09/2017   ID:  Ellin Saba, DOB 06/10/1931, MRN [id number]  PCP:  Paulina Fusi, MD  Cardiologist:  Norman Herrlich, MD    Referring MD: Paulina Fusi, MD    ASSESSMENT:    1. Coronary artery disease of native artery of native heart with stable angina pectoris (HCC)   2. Chronic diastolic heart failure (HCC)   3. Hypertensive heart disease with heart failure (HCC)   4. Pacemaker   5. Dyslipidemia    PLAN:    In order of problems listed above:  1. Stable recent point life that his care is palliative and I would continue his current medical treatment including aspirin and high intensity statin.   2. Stable continue his current diuretic 3. Stable blood pressure target 4. Stable function followed in device clinic I reviewed his recent check with the family 5. Stable continue statin await labs for liver function potassium sodium creatinine and LDL cholesterol for efficacy   Next appointment: 6 months   Medication Adjustments/Labs and Tests Ordered: Current medicines are reviewed at length with the patient today.  Concerns regarding medicines are outlined above.  No orders of the defined types were placed in this encounter.  No orders of the defined types were placed in this encounter.   Chief Complaint  Patient presents with  . Follow-up    History of Present Illness:    Travis [name] is a 84 y.o. male with a hx of CAD , Diastolic CHF, S/P CABG in 2012, PAF and pacemaker last seen 6 months ago. Compliance with diet, lifestyle and medications: Yes he supervised by his son He continues to decline functionally wheelchair-bound depends on others for ADLs cognitively he no longer speaks to the family.  Unfortunately he was denied surgery for his back issues and pain because of his dementia.  He is voicing no chest pain is not been short of breath he has had no edema and the son thinks that his cardiac issues are stable at this time.   Recent labs in the last 3 months requested from his PCP.  Recent pacemaker checks 09/26/1988 showed normal parameters Past Medical History:  Diagnosis Date  . Arthritis    arthritis of spine, DDD  . Back pain    'spinal stenosis", s/p herniation disc surgery  . Benign essential hypertension 09/04/2015  . Carotid stenosis 03/07/2017  . Chest pain 09/04/2015  . CHF (congestive heart failure) (HCC)   . Chronic bilateral low back pain with left-sided sciatica 04/18/2016  . Chronic coronary artery disease 09/04/2015  . Chronic diastolic heart failure (HCC) 03/07/2017  . Chronic kidney disease    some kidney filtering issues-now showing improvement  . Chronic left hip pain 04/18/2016  . Complete atrioventricular block (HCC) 07/27/2015  . Coronary arteriosclerosis in native artery 09/04/2015  . Coronary artery disease   . Coronary disorder 03/07/2017  . Diabetes mellitus without complication (HCC)   . Diverticulosis    past history Diverticulitis  . Elevated cholesterol   . Encounter for adjustment and management of other part of cardiac pacemaker 07/27/2015  . Fracture of femur, trochanteric (HCC)    left  . GERD (gastroesophageal reflux disease)   . Heart block 09/04/2015  . Hypertension   . Lumbar radiculopathy 02/06/2017  . Lumbar spinal stenosis 12/07/2014  . Mitral valve disease 09/04/2015  . Myocardial infarction (HCC)   . Neuromuscular disorder (HCC)    neuropathy legs/feet  . Pacemaker reprogramming/check 07/27/2015  .  Paroxysmal atrial fibrillation (HCC) 03/07/2017  . Presence of permanent cardiac pacemaker    [place] - 05-10-13 Medtronic" bradycardia" RBBB  . S/P lumbar laminectomy 04/18/2016  . Scoliosis due to degenerative disease of spine in adult patient 03/06/2017  . Severe sinus bradycardia 09/04/2015  . Sleep apnea    no cpap used, couldn't tolerate  . Spinal stenosis of lumbar region 12/07/2014  . Spondylolisthesis at L3-L4 level 03/06/2017  . Stenosis of carotid artery 09/04/2015    . Stroke (HCC)   . Transient complete heart block (HCC) 07/27/2015    Past Surgical History:  Procedure Laterality Date  . AORTIC VALVE REPLACEMENT     7'13  . APPENDECTOMY    . BACK SURGERY     x1 lumbar  . COLON SURGERY     colon resection due to inflammation "diverticulitis"  . CORONARY ANGIOPLASTY     x 1 stent  . CORONARY ARTERY BYPASS GRAFT     x1 - 7'13  in [place]  . HERNIA REPAIR    . LUMBAR LAMINECTOMY/DECOMPRESSION MICRODISCECTOMY N/A 12/07/2014   Procedure: DECOMPRESSION L3-L4 L2-L3,discectomy L3,4  ;  Surgeon: Jene Every, MD;  Location: [facility];  Service: Orthopedics;  Laterality: N/A;    Current Medications: Current Meds  Medication Sig  . albuterol (PROVENTIL HFA;VENTOLIN HFA) 108 (90 BASE) MCG/ACT inhaler Inhale 2 puffs into the lungs every 6 (six) hours as needed for wheezing or shortness of breath.  Marland Kitchen aspirin EC 81 MG tablet Take 1 tablet (81 mg total) by mouth daily. Resume 4 days post-op  . atorvastatin (LIPITOR) 80 MG tablet Take 80 mg by mouth at bedtime.  . diazepam (VALIUM) 10 MG tablet Take 10 mg by mouth every 6 (six) hours as needed for anxiety.  . docusate sodium (COLACE) 100 MG capsule Take 1 capsule (100 mg total) by mouth 2 (two) times daily as needed for mild constipation.  Marland Kitchen escitalopram (LEXAPRO) 20 MG tablet Take 20 mg by mouth daily.  . furosemide (LASIX) 40 MG tablet Take 40 mg by mouth daily as needed for fluid.  Marland Kitchen HYDROcodone-acetaminophen (NORCO/VICODIN) 5-325 MG per tablet Take 1 tablet by mouth every 4 (four) hours as needed.  . insulin lispro (HUMALOG) 100 UNIT/ML injection Inject 20 Units into the skin daily.   . Wound Dressings (MEDIHONEY WOUND/BURN DRESSING) GEL Apply small amount to open area of wound daily. Cover with band-aid     Allergies:   Patient has no known allergies.   Social History   Socioeconomic History  . Marital status: Married    Spouse name: None  . Number of children: None  . Years of education: None   . Highest education level: None  Social Needs  . Financial resource strain: None  . Food insecurity - worry: None  . Food insecurity - inability: None  . Transportation needs - medical: None  . Transportation needs - non-medical: None  Occupational History  . None  Tobacco Use  . Smoking status: Former Smoker    Types: Cigarettes    Last attempt to quit: 11/28/1988    Years since quitting: 28.8  . Smokeless tobacco: Never Used  Substance and Sexual Activity  . Alcohol use: No    Comment: Quit 18 yrs ago  . Drug use: No  . Sexual activity: None  Other Topics Concern  . None  Social History Narrative  . None     Family History: The patient's family history includes Cancer in his brother and mother;  Stroke in his brother; Tuberculosis in his father. ROS:   Please see the history of present illness.    All other systems reviewed and are negative.  EKGs/Labs/Other Studies Reviewed:    The following studies were reviewed today  Recent Labs: No results found for requested labs within last 8760 hours.  Recent Lipid Panel No results found for: CHOL, TRIG, HDL, CHOLHDL, VLDL, LDLCALC, LDLDIRECT  Physical Exam:    VS:  Ht 5\' 7"  (1.702 m)   Wt 153 lb (69.4 kg) Comment: Reported  BMI 23.96 kg/m     Wt Readings from Last 3 Encounters:  10/09/17 153 lb (69.4 kg)  04/09/17 192 lb 6.4 oz (87.3 kg)  03/12/17 193 lb (87.5 kg)     GEN:  Well nourished, well developed in no acute distress HEENT: Normal NECK: No JVD; No carotid bruits LYMPHATICS: No lymphadenopathy CARDIAC: RRR, no murmurs, rubs, gallops RESPIRATORY:  Clear to auscultation without rales, wheezing or rhonchi  ABDOMEN: Soft, non-tender, non-distended MUSCULOSKELETAL:  No edema; No deformity  SKIN: Warm and dry NEUROLOGIC:  Alert and oriented x 3 PSYCHIATRIC:  Normal affect    Signed, Norman HerrlichBrian Rehaan Viloria, MD  10/09/2017 11:21 AM    [facility]

## 2017-10-09 ENCOUNTER — Ambulatory Visit (INDEPENDENT_AMBULATORY_CARE_PROVIDER_SITE_OTHER): Payer: Medicare Other | Admitting: Cardiology

## 2017-10-09 ENCOUNTER — Encounter: Payer: Self-pay | Admitting: *Deleted

## 2017-10-09 ENCOUNTER — Encounter: Payer: Self-pay | Admitting: Cardiology

## 2017-10-09 VITALS — BP 110/66 | HR 64 | Resp 14 | Ht 67.0 in | Wt 153.0 lb

## 2017-10-09 DIAGNOSIS — Z950 Presence of cardiac pacemaker: Secondary | ICD-10-CM

## 2017-10-09 DIAGNOSIS — I5032 Chronic diastolic (congestive) heart failure: Secondary | ICD-10-CM | POA: Diagnosis not present

## 2017-10-09 DIAGNOSIS — I110 Hypertensive heart disease with heart failure: Secondary | ICD-10-CM

## 2017-10-09 DIAGNOSIS — E785 Hyperlipidemia, unspecified: Secondary | ICD-10-CM | POA: Diagnosis not present

## 2017-10-09 DIAGNOSIS — I25118 Atherosclerotic heart disease of native coronary artery with other forms of angina pectoris: Secondary | ICD-10-CM | POA: Diagnosis not present

## 2017-10-09 NOTE — Patient Instructions (Signed)

## 2017-10-20 ENCOUNTER — Ambulatory Visit (INDEPENDENT_AMBULATORY_CARE_PROVIDER_SITE_OTHER): Payer: Medicare Other | Admitting: Podiatry

## 2017-10-20 DIAGNOSIS — I872 Venous insufficiency (chronic) (peripheral): Secondary | ICD-10-CM | POA: Diagnosis not present

## 2017-10-20 DIAGNOSIS — I83028 Varicose veins of left lower extremity with ulcer other part of lower leg: Secondary | ICD-10-CM

## 2017-10-20 DIAGNOSIS — L97821 Non-pressure chronic ulcer of other part of left lower leg limited to breakdown of skin: Secondary | ICD-10-CM

## 2017-10-20 DIAGNOSIS — L97511 Non-pressure chronic ulcer of other part of right foot limited to breakdown of skin: Secondary | ICD-10-CM | POA: Diagnosis not present

## 2017-10-21 ENCOUNTER — Telehealth: Payer: Self-pay | Admitting: *Deleted

## 2017-10-21 DIAGNOSIS — I83015 Varicose veins of right lower extremity with ulcer other part of foot: Secondary | ICD-10-CM

## 2017-10-21 DIAGNOSIS — I83028 Varicose veins of left lower extremity with ulcer other part of lower leg: Secondary | ICD-10-CM

## 2017-10-21 DIAGNOSIS — L97511 Non-pressure chronic ulcer of other part of right foot limited to breakdown of skin: Secondary | ICD-10-CM

## 2017-10-21 DIAGNOSIS — L97821 Non-pressure chronic ulcer of other part of left lower leg limited to breakdown of skin: Principal | ICD-10-CM

## 2017-10-21 DIAGNOSIS — I872 Venous insufficiency (chronic) (peripheral): Secondary | ICD-10-CM

## 2017-10-21 NOTE — Telephone Encounter (Signed)
Faxed referral, clinicals and demographics to [facility].

## 2017-10-21 NOTE — Progress Notes (Signed)
  Subjective:  Patient ID: Travis [name], male    DOB: 12-Apr-1931,  MRN: [id number]  Chief Complaint  Patient presents with  . Wound Check    F/U wound check Pt's daughter stated," it looks the same and it's painful." Tx: antibiotic cream (not helping)   84 y.o. male returns for wound care. Believes the wound to be about the same. States they are very painful. Have been using abx cream and states they aren't helping. Patient presents in wheelchair with his daughter who assists in history. Denies N/V/F/Ch.  Objective:  There were no vitals filed for this visit. General AA&O x3. Normal mood and affect.  Vascular Foot warm to touch.  Neurologic Sensation grossly diminished.  Dermatologic (Wound) Wound Location: R lateral malleolus Wound Measurement: 1x1x0.1 Wound Base: Mixed Granular/Fibrotic Peri-wound: Calloused Exudate: None: wound tissue dry  Wound progress: No Change since last check.  Wound Location: L lateral malleolus Wound Measurement: 0.5x0.5x0.1 Wound Base: Mixed Granular/Fibrotic Peri-wound: Calloused Exudate: None: wound tissue dry  Wound progress: No Change since last check.  Orthopedic: No pain to palpation either foot.   Assessment & Plan:  Patient was evaluated and treated and all questions answered.  VLU bilateral ankles -Ulcer RLE gently debrided as below -Dressed with wet to dry dressing, DSD bilat -Discussed referral to [facility].  Procedure: Selective Debridement of Wound Rationale: Removal of devitalized tissue from the wound to promote healing.  Pre-Debridement Wound Measurements: 1 cm x 1 cm x 0.1 cm  Post-Debridement Wound Measurements: same as pre-debridement. Type of Debridement: Selective Tissue Removed: Devitalized soft-tissue Instrumentation: 3-0 mm dermal curette Dressing: Dry, sterile, compression dressing. Disposition: Patient tolerated procedure well. Patient to return in 1 week for follow-up.   Return if symptoms worsen  or fail to improve.

## 2017-10-21 NOTE — Telephone Encounter (Signed)
-----   Message from Park LiterMichael J Price, DPM sent at 10/21/2017  7:23 AM EDT ----- Regarding: Wound Care Referral Can we refer this patient to the wound center at [facility]? Thanks!

## 2017-10-29 DIAGNOSIS — I639 Cerebral infarction, unspecified: Secondary | ICD-10-CM | POA: Diagnosis not present

## 2017-10-29 DIAGNOSIS — L89519 Pressure ulcer of right ankle, unspecified stage: Secondary | ICD-10-CM | POA: Diagnosis not present

## 2017-10-29 DIAGNOSIS — I252 Old myocardial infarction: Secondary | ICD-10-CM | POA: Diagnosis not present

## 2017-10-29 DIAGNOSIS — E119 Type 2 diabetes mellitus without complications: Secondary | ICD-10-CM | POA: Diagnosis not present

## 2017-10-29 DIAGNOSIS — I10 Essential (primary) hypertension: Secondary | ICD-10-CM | POA: Diagnosis not present

## 2017-10-29 DIAGNOSIS — L89510 Pressure ulcer of right ankle, unstageable: Secondary | ICD-10-CM | POA: Diagnosis not present

## 2017-10-29 DIAGNOSIS — L89520 Pressure ulcer of left ankle, unstageable: Secondary | ICD-10-CM | POA: Diagnosis not present

## 2017-10-29 DIAGNOSIS — Z955 Presence of coronary angioplasty implant and graft: Secondary | ICD-10-CM | POA: Diagnosis not present

## 2017-10-29 DIAGNOSIS — L89529 Pressure ulcer of left ankle, unspecified stage: Secondary | ICD-10-CM | POA: Diagnosis not present

## 2017-10-29 DIAGNOSIS — Z794 Long term (current) use of insulin: Secondary | ICD-10-CM | POA: Diagnosis not present

## 2017-10-29 DIAGNOSIS — D519 Vitamin B12 deficiency anemia, unspecified: Secondary | ICD-10-CM | POA: Diagnosis not present

## 2017-10-29 DIAGNOSIS — I2510 Atherosclerotic heart disease of native coronary artery without angina pectoris: Secondary | ICD-10-CM | POA: Diagnosis not present

## 2017-10-29 DIAGNOSIS — M1990 Unspecified osteoarthritis, unspecified site: Secondary | ICD-10-CM | POA: Diagnosis not present

## 2017-11-02 NOTE — Progress Notes (Signed)
Subjective:  Patient ID: Travis [name], male    DOB: 08-19-1930,  MRN: [id number]  Chief Complaint  Patient presents with  . Foot Ulcer    ulcer both ankles     84 y.o. male presents with the above complaint.  Past Medical History:  Diagnosis Date  . Arthritis    arthritis of spine, DDD  . Back pain    'spinal stenosis", s/p herniation disc surgery  . Benign essential hypertension 09/04/2015  . Carotid stenosis 03/07/2017  . Chest pain 09/04/2015  . CHF (congestive heart failure) (HCC)   . Chronic bilateral low back pain with left-sided sciatica 04/18/2016  . Chronic coronary artery disease 09/04/2015  . Chronic diastolic heart failure (HCC) 03/07/2017  . Chronic kidney disease    some kidney filtering issues-now showing improvement  . Chronic left hip pain 04/18/2016  . Complete atrioventricular block (HCC) 07/27/2015  . Coronary arteriosclerosis in native artery 09/04/2015  . Coronary artery disease   . Coronary disorder 03/07/2017  . Diabetes mellitus without complication (HCC)   . Diverticulosis    past history Diverticulitis  . Elevated cholesterol   . Encounter for adjustment and management of other part of cardiac pacemaker 07/27/2015  . Fracture of femur, trochanteric (HCC)    left  . GERD (gastroesophageal reflux disease)   . Heart block 09/04/2015  . Hypertension   . Lumbar radiculopathy 02/06/2017  . Lumbar spinal stenosis 12/07/2014  . Mitral valve disease 09/04/2015  . Myocardial infarction (HCC)   . Neuromuscular disorder (HCC)    neuropathy legs/feet  . Pacemaker reprogramming/check 07/27/2015  . Paroxysmal atrial fibrillation (HCC) 03/07/2017  . Presence of permanent cardiac pacemaker    [place] 05-10-13 Medtronic" bradycardia" RBBB  . S/P lumbar laminectomy 04/18/2016  . Scoliosis due to degenerative disease of spine in adult patient 03/06/2017  . Severe sinus bradycardia 09/04/2015  . Sleep apnea    no cpap used, couldn't tolerate  . Spinal stenosis of lumbar  region 12/07/2014  . Spondylolisthesis at L3-L4 level 03/06/2017  . Stenosis of carotid artery 09/04/2015  . Stroke (HCC)   . Transient complete heart block (HCC) 07/27/2015   Past Surgical History:  Procedure Laterality Date  . AORTIC VALVE REPLACEMENT     7'13  . APPENDECTOMY    . BACK SURGERY     x1 lumbar  . COLON SURGERY     colon resection due to inflammation "diverticulitis"  . CORONARY ANGIOPLASTY     x 1 stent  . CORONARY ARTERY BYPASS GRAFT     x1 - 7'13  in [place]  . HERNIA REPAIR    . LUMBAR LAMINECTOMY/DECOMPRESSION MICRODISCECTOMY N/A 12/07/2014   Procedure: DECOMPRESSION L3-L4 L2-L3,discectomy L3,4  ;  Surgeon: Jene EveryJeffrey Beane, MD;  Location: [facility];  Service: Orthopedics;  Laterality: N/A;    Current Outpatient Medications:  .  albuterol (PROVENTIL HFA;VENTOLIN HFA) 108 (90 BASE) MCG/ACT inhaler, Inhale 2 puffs into the lungs every 6 (six) hours as needed for wheezing or shortness of breath., Disp: , Rfl:  .  aspirin EC 81 MG tablet, Take 1 tablet (81 mg total) by mouth daily. Resume 4 days post-op, Disp: , Rfl:  .  atorvastatin (LIPITOR) 80 MG tablet, Take 80 mg by mouth at bedtime., Disp: , Rfl:  .  diazepam (VALIUM) 10 MG tablet, Take 10 mg by mouth every 6 (six) hours as needed for anxiety., Disp: , Rfl:  .  docusate sodium (COLACE) 100 MG capsule, Take 1 capsule (100  mg total) by mouth 2 (two) times daily as needed for mild constipation., Disp: 20 capsule, Rfl: 1 .  escitalopram (LEXAPRO) 20 MG tablet, Take 20 mg by mouth daily., Disp: , Rfl:  .  furosemide (LASIX) 40 MG tablet, Take 40 mg by mouth daily as needed for fluid., Disp: , Rfl:  .  HYDROcodone-acetaminophen (NORCO/VICODIN) 5-325 MG per tablet, Take 1 tablet by mouth every 4 (four) hours as needed., Disp: 60 tablet, Rfl: 0 .  insulin lispro (HUMALOG) 100 UNIT/ML injection, Inject 20 Units into the skin daily. , Disp: , Rfl:  .  Wound Dressings (MEDIHONEY WOUND/BURN DRESSING) GEL, Apply small amount to open  area of wound daily. Cover with band-aid, Disp: 15 mL, Rfl: 1  No Known Allergies Review of Systems Objective:  There were no vitals filed for this visit. General AA&O x3. Normal mood and affect.  Vascular Dorsalis pedis and posterior tibial pulses  present 2+ bilaterally  Capillary refill normal to all digits. Pedal hair growth normal.  Neurologic Epicritic sensation grossly present.  Dermatologic No open lesions. Interspaces clear of maceration. Nails well groomed and normal in appearance.  L  R Superficial ulcerations to the malleoli, left lateral malleolus measuring 0.5 diameter, right malleolus measuring 1.0 diameter fibro-granular wound base  Orthopedic: MMT 5/5 in dorsiflexion, plantarflexion, inversion, and eversion. Normal joint ROM without pain or crepitus.   Assessment & Plan:  Patient was evaluated and treated and all questions answered.  Venous leg wounds bilateral ankles -Wounds debrided as below -Silvadene and DSD applied -Follow-up in 2 weeks for recheck  Procedure: Selective Debridement of Wound Rationale: Removal of devitalized tissue from the wound to promote healing.  Pre-Debridement Wound Measurements: 1 cm x 1 cm x 0.1 cm, 0.5x0.5x0.1 Post-Debridement Wound Measurements: same as pre-debridement. Type of Debridement: Selective Tissue Removed: Devitalized soft-tissue Instrumentation: dermal curette Dressing: Dry, sterile, compression dressing. Disposition: Patient tolerated procedure well. Patient to return in 1 week for follow-up.    Return in about 2 weeks (around 10/20/2017) for Wound check.

## 2017-11-05 DIAGNOSIS — L89520 Pressure ulcer of left ankle, unstageable: Secondary | ICD-10-CM | POA: Diagnosis not present

## 2017-11-05 DIAGNOSIS — L89510 Pressure ulcer of right ankle, unstageable: Secondary | ICD-10-CM | POA: Diagnosis not present

## 2017-11-05 DIAGNOSIS — E119 Type 2 diabetes mellitus without complications: Secondary | ICD-10-CM | POA: Diagnosis not present

## 2017-11-10 ENCOUNTER — Telehealth: Payer: Self-pay | Admitting: Podiatry

## 2017-11-10 ENCOUNTER — Telehealth: Payer: Self-pay | Admitting: *Deleted

## 2017-11-10 MED ORDER — SILVER SULFADIAZINE 1 % EX CREA: 1.0000 "application " | TOPICAL_CREAM | Freq: Every day | CUTANEOUS | 0 refills | Status: AC

## 2017-11-10 NOTE — Telephone Encounter (Signed)
Silvadene. We can send him an Rx.

## 2017-11-10 NOTE — Telephone Encounter (Signed)
Left message informing pt Dr. Samuella CotaPrice had ordered a cream for his wound and [facility]

## 2017-11-10 NOTE — Telephone Encounter (Signed)
Travis [name] Brennan phoned to say pt complained of left arm being numb in the last hour (3:40). No other symptoms to report would like a call back please

## 2017-11-10 NOTE — Telephone Encounter (Signed)
Left message to return call 

## 2017-11-10 NOTE — Telephone Encounter (Signed)
Pt son called and would like a sample of cream for wound. Pt is scheduled to be seen at the wound center on Thursday.

## 2017-11-10 NOTE — Addendum Note (Signed)
Addended by: Alphia Kava'CONNELL, Enslie Sahota D on: 11/10/2017 02:48 PM   Modules accepted: Orders

## 2017-11-11 NOTE — Telephone Encounter (Signed)
Spoke with son-in-law Travis [name], he states that the patient sits in his chair all day and does not move much. When the left arm became numb, the patient's daughter began to move it and the patient said it felt better. Jaclyn Primedvised David that if someone could help the patient move his arms and legs throughout the day that could be beneficial. Travis [name] states that they talked with the patient's PCP about having home health physical therapy come out and the PCP advised that it would not give the patient any improvement at this point. Travis [name] states that family members will try to move the patient's arms and legs while they are visiting. Jaclyn Primedvised David to call if this happens again or with any further questions or concerns. Travis [name] verbalized understanding, no further questions.

## 2017-11-20 DIAGNOSIS — L89510 Pressure ulcer of right ankle, unstageable: Secondary | ICD-10-CM | POA: Diagnosis not present

## 2017-11-20 DIAGNOSIS — E119 Type 2 diabetes mellitus without complications: Secondary | ICD-10-CM | POA: Diagnosis not present

## 2017-11-20 DIAGNOSIS — L89520 Pressure ulcer of left ankle, unstageable: Secondary | ICD-10-CM | POA: Diagnosis not present

## 2017-11-27 DIAGNOSIS — D519 Vitamin B12 deficiency anemia, unspecified: Secondary | ICD-10-CM | POA: Diagnosis not present

## 2017-11-27 DIAGNOSIS — L89520 Pressure ulcer of left ankle, unstageable: Secondary | ICD-10-CM | POA: Diagnosis not present

## 2017-11-27 DIAGNOSIS — L89510 Pressure ulcer of right ankle, unstageable: Secondary | ICD-10-CM | POA: Diagnosis not present

## 2017-11-27 DIAGNOSIS — E119 Type 2 diabetes mellitus without complications: Secondary | ICD-10-CM | POA: Diagnosis not present

## 2017-11-29 DIAGNOSIS — I639 Cerebral infarction, unspecified: Secondary | ICD-10-CM | POA: Diagnosis not present

## 2017-12-30 ENCOUNTER — Telehealth: Payer: Self-pay

## 2017-12-30 ENCOUNTER — Encounter: Payer: Medicare Other | Admitting: *Deleted

## 2017-12-30 NOTE — Telephone Encounter (Signed)
Confirmed remote transmission w/ pt son in law. He stated that the pt passed away. I ordered a return kit for the home monitor.

## 2018-01-03 DEATH — deceased
